# Patient Record
Sex: Female | Born: 1960 | Race: Black or African American | Hispanic: No | Marital: Single | Smoking: Never smoker
Health system: Southern US, Community
[De-identification: ages and names within clinical notes are randomized; demographics above are authoritative.]

## PROBLEM LIST (undated history)

## (undated) DIAGNOSIS — U071 COVID-19: Secondary | ICD-10-CM

## (undated) HISTORY — PX: ABDOMINAL HYSTERECTOMY: SHX81

---

## 2013-01-27 ENCOUNTER — Emergency Department (HOSPITAL_COMMUNITY): Payer: BC Managed Care – PPO

## 2013-01-27 ENCOUNTER — Encounter (HOSPITAL_COMMUNITY): Payer: Self-pay | Admitting: Nurse Practitioner

## 2013-01-27 ENCOUNTER — Emergency Department (HOSPITAL_COMMUNITY)
Admission: EM | Admit: 2013-01-27 | Discharge: 2013-01-27 | Disposition: A | Discharge status: Home / Self Care | Payer: BC Managed Care – PPO | Attending: Emergency Medicine | Admitting: Emergency Medicine

## 2013-01-27 DIAGNOSIS — R0602 Shortness of breath: Secondary | ICD-10-CM | POA: Insufficient documentation

## 2013-01-27 DIAGNOSIS — R0789 Other chest pain: Secondary | ICD-10-CM | POA: Insufficient documentation

## 2013-01-27 DIAGNOSIS — R079 Chest pain, unspecified: Secondary | ICD-10-CM

## 2013-01-27 LAB — PRO B NATRIURETIC PEPTIDE: Pro B Natriuretic peptide (BNP): 22 pg/mL (ref 0–125)

## 2013-01-27 LAB — POCT I-STAT TROPONIN I: Troponin i, poc: 0 ng/mL (ref 0.00–0.08)

## 2013-01-27 LAB — BASIC METABOLIC PANEL
BUN: 11 mg/dL (ref 6–23)
CO2: 29 mEq/L (ref 19–32)
Calcium: 9.3 mg/dL (ref 8.4–10.5)
Chloride: 102 mEq/L (ref 96–112)
GFR calc Af Amer: 71 mL/min — ABNORMAL LOW (ref >90)
GFR calc non Af Amer: 61 mL/min — ABNORMAL LOW (ref >90)

## 2013-01-27 LAB — CBC
HCT: 34.9 % — ABNORMAL LOW (ref 36.0–46.0)
MCV: 85.3 fL (ref 78.0–100.0)
RBC: 4.09 MIL/uL (ref 3.87–5.11)
RDW: 13.5 % (ref 11.5–15.5)
WBC: 7.6 10*3/uL (ref 4.0–10.5)

## 2013-01-27 MED ORDER — GI COCKTAIL ~~LOC~~
30.0000 mL | Freq: Once | ORAL | Status: AC
  Administered 2013-01-27: 30 mL via ORAL
  Filled 2013-01-27: qty 30

## 2013-01-27 NOTE — Discharge Instructions (Signed)
Chest Pain (Nonspecific) °It is often hard to give a specific diagnosis for the cause of chest pain. There is always a chance that your pain could be related to something serious, such as a heart attack or a blood clot in the lungs. You need to follow up with your caregiver for further evaluation. °CAUSES  °· Heartburn. °· Pneumonia or bronchitis. °· Anxiety or stress. °· Inflammation around your heart (pericarditis) or lung (pleuritis or pleurisy). °· A blood clot in the lung. °· A collapsed lung (pneumothorax). It can develop suddenly on its own (spontaneous pneumothorax) or from injury (trauma) to the chest. °· Shingles infection (herpes zoster virus). °The chest wall is composed of bones, muscles, and cartilage. Any of these can be the source of the pain. °· The bones can be bruised by injury. °· The muscles or cartilage can be strained by coughing or overwork. °· The cartilage can be affected by inflammation and become sore (costochondritis). °DIAGNOSIS  °Lab tests or other studies, such as X-rays, electrocardiography, stress testing, or cardiac imaging, may be needed to find the cause of your pain.  °TREATMENT  °· Treatment depends on what may be causing your chest pain. Treatment may include: °· Acid blockers for heartburn. °· Anti-inflammatory medicine. °· Pain medicine for inflammatory conditions. °· Antibiotics if an infection is present. °· You may be advised to change lifestyle habits. This includes stopping smoking and avoiding alcohol, caffeine, and chocolate. °· You may be advised to keep your head raised (elevated) when sleeping. This reduces the chance of acid going backward from your stomach into your esophagus. °· Most of the time, nonspecific chest pain will improve within 2 to 3 days with rest and mild pain medicine. °HOME CARE INSTRUCTIONS  °· If antibiotics were prescribed, take your antibiotics as directed. Finish them even if you start to feel better. °· For the next few days, avoid physical  activities that bring on chest pain. Continue physical activities as directed. °· Do not smoke. °· Avoid drinking alcohol. °· Only take over-the-counter or prescription medicine for pain, discomfort, or fever as directed by your caregiver. °· Follow your caregiver's suggestions for further testing if your chest pain does not go away. °· Keep any follow-up appointments you made. If you do not go to an appointment, you could develop lasting (chronic) problems with pain. If there is any problem keeping an appointment, you must call to reschedule. °SEEK MEDICAL CARE IF:  °· You think you are having problems from the medicine you are taking. Read your medicine instructions carefully. °· Your chest pain does not go away, even after treatment. °· You develop a rash with blisters on your chest. °SEEK IMMEDIATE MEDICAL CARE IF:  °· You have increased chest pain or pain that spreads to your arm, neck, jaw, back, or abdomen. °· You develop shortness of breath, an increasing cough, or you are coughing up blood. °· You have severe back or abdominal pain, feel nauseous, or vomit. °· You develop severe weakness, fainting, or chills. °· You have a fever. °THIS IS AN EMERGENCY. Do not wait to see if the pain will go away. Get medical help at once. Call your local emergency services (911 in [place] Do not drive yourself to the hospital. °MAKE SURE YOU:  °· Understand these instructions. °· Will watch your condition. °· Will get help right away if you are not doing well or get worse. °Document Released: 03/30/2005 Document Revised: 09/12/2011 Document Reviewed: 01/24/2008 °ExitCare® Patient Information ©2014 ExitCare,   LLC. RESOURCE GUIDE  Chronic Pain Problems: Contact [facility]  [phone] Patients need to be referred by their primary care doctor.  Insufficient Money for Medicine: Contact United Way:  call [phone]  No Primary Care Doctor: - Call Health Connect  [phone] - can help you locate a  primary care doctor that  accepts your insurance, provides certain services, etc. - Physician Referral Service- [phone]  Agencies that provide inexpensive medical care: - [facility]  [phone] - [facility]  [phone] - [facility]  [phone] - Women's Clinic  [phone] - [facility]  [phone] [facility]  [phone]  Medicaid-accepting [facility] Providers: - [facility]- [address]  [phone], Mon-Fri 9am-7pm, Sat 9am-1pm - [facility]- [address] [place], [address] [place]  [phone] - [facility]- [address] [place]  [phone] [facility]- [address]  [phone] - Renaye Rakers- [address] [place], [address], [phone]  Only accepts [place] patients after they have their name  applied to their card  Self Pay (no insurance) in [place]: - Sickle Cell Patients - [facility]  [address] [place], [phone] - [facility]- [address] [place]  [phone]       [facility]- [address]       -     [facility]- see information above (Speak to [employer] if you do not have insurance)       -  [facility]- [address],  [phone]       -  [facility]- [address] [phone]       -  Dr Julio Sicks-  [address], [place], [phone]       -  [facility] - [address], [phone]       -  [facility]- [address] [phone], also [address], [phone]       -     [facility]- [address] [place], [phone], 1st & 3rd Saturday         every month, 10am-1pm  -     [facility]   [address] [place], [place].   Phone:  [phone], Fax:  [phone]. Hours of Operation:  9 am - 6 pm,  M-F.  -     [facility]   [address], [place]   Phone: [phone], Fax: [phone]. Hours of Operation:  8:30 am - 5:30 pm, M-F.  [facility] [address] [place], [place] [address] [phone]  [facility] [address] [place], [place] [address] [phone]  1) Find a Doctor and Pay Out of Pocket Although you won't have to find out who is covered by your insurance plan, it is a good idea to ask around and get recommendations. You will then need to call the office and see if the doctor you have chosen will accept you as a new patient and what types of options they offer for patients who are self-pay. Some doctors offer discounts or will set up payment plans for their patients who do not have  insurance, but you will need to ask so you aren't surprised when you get to your appointment.  2) Contact Your Local Health Department Not all health departments have doctors that can see patients for sick visits, but many do, so it is worth a call to see if yours does. If you don't know where your local health department is, you can check in your phone book. The CDC also has a tool to help you locate your state's health department, and many state websites also have listings of all of their local health departments.  3) Find a Walk-in Clinic If your illness is not likely to be very severe or complicated, you may want to try a walk in clinic. These are popping up all over the country in pharmacies, drugstores, and shopping centers. They're usually staffed by nurse practitioners or physician assistants that have been trained to treat common illnesses and complaints. They're usually fairly quick and inexpensive. However, if you have serious medical issues or chronic medical problems, these are probably not your best option  STD Testing - [facility] Department of [facility], STD Clinic, [address]  [place], phone [phone] or [phone].  Monday - Friday, call for an appointment. [facility] Department of [employer], STD Clinic, [address], [place], phone [phone] or [phone].  Monday - Friday, call for an appointment.  Abuse/Neglect: [facility] Child Abuse Hotline 309-729-4189 [facility] Child Abuse Hotline 913-566-5120 (After Hours)  Emergency Shelter:  [facility] 2347549521  Maternity Homes: - Room at the [place] [facility] [phone] - [facility] 5621439540  MRSA Hotline #:   (704)032-0079  Dental Assistance If unable to pay or uninsured, contact:  [facility]. to become qualified for the adult dental clinic.  Patients with Medicaid: [facility] [phone] [address] Joellyn Quails, [phone] [address] [phone]  If unable to pay, or uninsured, contact [facility] [phone] in [place], [phone] in [facility]) to become qualified for the adult dental clinic  [facility] [address] [place], [place] [address] [phone] [email or web address]  Other Proofreader Services: - [facility]- [address] [place], [place], [place], [address], [phone], Ext. [phone], 2nd and 4th Thursday of the month at 6:30am.  10 clients each day by appointment, can sometimes see walk-in patients if someone does not show for an appointment. [facility]- [address] [place], [place], [address], [phone] - [facility] [address], [place], [place], [address], [phone] - [facility]- [phone] [facility] Health Department- [phone] [facility] Health [phone]       Behavioral Health Resources in the [facility]  Intensive Outpatient Programs: [facility]      [address] [place], [place] [phone] Both  a day and evening program       [facility] Outpatient     [address]        [place], [place] [address] [phone]         ADS: [facility] [address] [place] [phone]  [facility] Mental Health ACCESS LINE: (234)[phone] or [phone] [address] [place], [place] [address] [email or web address]   Substance Abuse Resources: - [facility] Drug [facility]  [phone] - [facility] 7658056536 - [facility] [phone] -  Floydene Flock [phone] - Residential & Outpatient Substance Abuse Program  [phone]  Psychological Services: [facility]  [phone] Services  [phone] - [facility], [phone] [place]. [address] [place], ACCESS LINE: [phone] or [phone], [email or web address]  Mobile Crisis Teams:                                        Therapeutic Alternatives         Mobile Crisis Care Unit [phone]             [facility] [address] Ginette Otto [phone]                                         Interventionist [address] [place] [phone]  Self-Help/Support Groups: Mental Health Assoc. of [employer] of support groups [phone] (call for more info)  Narcotics Anonymous (NA) [facility] [address] [place] - 2 meetings at this location  Residential Treatment Programs:  [facility]      [address]        [place]       [phone]         [facility] [address], [place] [address]16 [place], [place]  [address] [phone]  [facility]  [address] [place], [place] [address] [phone] Admissions: 8am-3pm M-F  [facility]     [address]        [place], [place]  [address]       [phone]         [facility] [address] [place]s [place], [place] [phone]  [facility] [address] [place], [place] [phone]  [facility] - Intensive Outpatient      [address]     [place], [place]       [phone]         [facility] (Addiction Recovery [facility]     [address] [place], [place] [phone] or [phone]  Residential Treatment Services (RTS), Medicaid [address] [place], [place] [phone]  [facility] [address]                                               [address] [place] [phone]  [facility] Resources: [phone]               General Therapy                                                Angie Fava, PhD        [address] [place], [place] [address]         [phone]   Insurance  [facility] Behavioral   [address] [place], [place] [address] [phone]  [facility] [address] [place], [place] [address] [phone] Insurance/Medicaid/sponsorship through [facility]                                              [address]                                        [place], [place] [address]    Therapy/tele-psych/case         [phone]          [facility] [address], [place]  [address]  Adolescent/group home/case management [phone]                                           Creola Corn PhD       General therapy       Insurance   [phone]         Dr. Lolly Mustache, Insurance, M-F [phone]  [facility] [place]  United Way [facility] Dept. [address]                 [address]         [address] [place] [address]  Blondell Reveal Phone:  [phone]                                  Phone:   [phone]                   Phone:  [phone]  [facility], [phone] - [facility] - [facility]- [phone]       -     [facility] in [place], [address]             [phone], Insurance  [place] Child Abuse Hotline [phone] or [phone] (After Hours)

## 2013-01-27 NOTE — ED Notes (Signed)
C/o midsternal CP intermittent throughout the day today, no pain now. States "when it comes it feels like a contraction." pain makes the pt feel SOB. Denies cardiac history

## 2013-01-27 NOTE — ED Provider Notes (Signed)
CSN: [id number]     Arrival date & time 01/27/13  1813 History     First MD Initiated Contact with Patient 01/27/13 1838     Chief Complaint  Patient presents with  . Chest Pain   (Consider location/radiation/quality/duration/timing/severity/associated sxs/prior Treatment) HPI Comments: Patient presents emergency department with chief complaint of intermittent chest pain which started today. She's currently not complaining of any chest pain, but states that earlier she felt a sharp pain in her mid chest. States that it radiated to her neck. She states that it comes and goes, and sometimes feels like a burning. She states that there was some mild associated shortness of breath. She has not taken anything to alleviate her symptoms. Nothing makes his symptoms better or worse. She does not have any cardiac risk factors.  The history is provided by the patient. No language interpreter was used.    History reviewed. No pertinent past medical history. Past Surgical History  Procedure Laterality Date  . Abdominal hysterectomy     History reviewed. No pertinent family history. History  Substance Use Topics  . Smoking status: Never Smoker   . Smokeless tobacco: Not on file  . Alcohol Use: No   OB History   Grav Para Term Preterm Abortions TAB SAB Ect Mult Living                 Review of Systems  All other systems reviewed and are negative.    Allergies  Review of patient's allergies indicates no known allergies.  Home Medications  No current outpatient prescriptions on file. BP 113/62  Pulse 95  Temp(Src) 98.5 F (36.9 C) (Oral)  Resp 16  Ht 5\' 4"  (1.626 m)  Wt 165 lb (74.844 kg)  BMI 28.31 kg/m2  SpO2 98% Physical Exam  Nursing note and vitals reviewed. Constitutional: She is oriented to person, place, and time. She appears well-developed and well-nourished.  HENT:  Head: Normocephalic and atraumatic.  Eyes: Conjunctivae and EOM are normal. Pupils are equal, round,  and reactive to light.  Neck: Normal range of motion. Neck supple.  Cardiovascular: Normal rate and regular rhythm.  Exam reveals no gallop and no friction rub.   No murmur heard. Pulmonary/Chest: Effort normal and breath sounds normal. No respiratory distress. She has no wheezes. She has no rales. She exhibits no tenderness.  Abdominal: Soft. Bowel sounds are normal. She exhibits no distension and no mass. There is no tenderness. There is no rebound and no guarding.  Musculoskeletal: Normal range of motion. She exhibits no edema and no tenderness.  Neurological: She is alert and oriented to person, place, and time.  Skin: Skin is warm and dry.  Psychiatric: She has a normal mood and affect. Her behavior is normal. Judgment and thought content normal.    ED Course   Procedures (including critical care time)  Labs Reviewed  CBC - Abnormal; Notable for the following:    HCT 34.9 (*)    MCHC 36.1 (*)    All other components within normal limits  BASIC METABOLIC PANEL - Abnormal; Notable for the following:    GFR calc non Af Amer 61 (*)    GFR calc Af Amer 71 (*)    All other components within normal limits  PRO B NATRIURETIC PEPTIDE  ED ECG REPORT  I personally interpreted this EKG   Date: 01/27/2013   Rate: 96  Rhythm: normal sinus rhythm  QRS Axis: normal  Intervals: normal  ST/T Wave abnormalities: normal  Conduction Disutrbances:none  Narrative Interpretation:   Old EKG Reviewed: none available   Dg Chest 2 View  01/27/2013   *RADIOLOGY REPORT*  Clinical Data: Chest pain and cough.  CHEST - 2 VIEW  Comparison: 01/24/2013  Findings: Heart size and vascularity are normal and the lungs are clear.  No osseous abnormality.  IMPRESSION: Normal chest.   Original Report Authenticated By: Francene Boyers, M.D.   1. Chest pain     MDM  Patient with nonspecific chest pain, no chest pain in the emergency department, TIMI score is 0, she has Well's PE score of 0.  Chest x-ray is clear.  Doubt cardiac etiology. Labs, and EKG are reassuring. Patient discussed with Dr. Rhunette Croft, who agrees with the plan. She is stable and ready for discharge. Discharge to home with primary care followup.   Roxy Horseman, PA-C 01/27/13 2119

## 2013-01-27 NOTE — ED Notes (Signed)
Pt alert, NAD, calm, interactive, skin W&D, resps e/u, speaking in clear complete sentences, LS CTA, pulse regular and strong, (denies: pain, dizziness, nausea or sob), "feels better", ambulatory to b/r with steady gait.

## 2013-01-28 NOTE — ED Provider Notes (Signed)
Medical screening examination/treatment/procedure(s) were performed by non-physician practitioner and as supervising physician I was immediately available for consultation/collaboration.  Nadia Viar, MD 01/28/13 1716 

## 2013-04-09 ENCOUNTER — Emergency Department (HOSPITAL_COMMUNITY): Payer: BC Managed Care – PPO

## 2013-04-09 ENCOUNTER — Encounter (HOSPITAL_COMMUNITY): Payer: Self-pay | Admitting: Nurse Practitioner

## 2013-04-09 ENCOUNTER — Emergency Department (HOSPITAL_COMMUNITY)
Admission: EM | Admit: 2013-04-09 | Discharge: 2013-04-09 | Disposition: A | Discharge status: Home / Self Care | Payer: BC Managed Care – PPO | Attending: Emergency Medicine | Admitting: Emergency Medicine

## 2013-04-09 DIAGNOSIS — R51 Headache: Secondary | ICD-10-CM | POA: Insufficient documentation

## 2013-04-09 DIAGNOSIS — R110 Nausea: Secondary | ICD-10-CM | POA: Insufficient documentation

## 2013-04-09 MED ORDER — TRAMADOL HCL 50 MG PO TABS: 50.0000 mg | ORAL_TABLET | Freq: Four times a day (QID) | ORAL | Status: DC | PRN

## 2013-04-09 MED ORDER — KETOROLAC TROMETHAMINE 30 MG/ML IJ SOLN
30.0000 mg | Freq: Once | INTRAMUSCULAR | Status: AC
  Administered 2013-04-09: 30 mg via INTRAVENOUS
  Filled 2013-04-09: qty 1

## 2013-04-09 MED ORDER — METOCLOPRAMIDE HCL 5 MG/ML IJ SOLN
10.0000 mg | Freq: Once | INTRAMUSCULAR | Status: AC
  Administered 2013-04-09: 10 mg via INTRAVENOUS
  Filled 2013-04-09: qty 2

## 2013-04-09 MED ORDER — SODIUM CHLORIDE 0.9 % IV BOLUS (SEPSIS)
1000.0000 mL | Freq: Once | INTRAVENOUS | Status: AC
  Administered 2013-04-09: 1000 mL via INTRAVENOUS

## 2013-04-09 MED ORDER — DIPHENHYDRAMINE HCL 50 MG/ML IJ SOLN
25.0000 mg | Freq: Once | INTRAMUSCULAR | Status: AC
  Administered 2013-04-09: 25 mg via INTRAVENOUS
  Filled 2013-04-09: qty 1

## 2013-04-09 MED ORDER — CYCLOBENZAPRINE HCL 10 MG PO TABS: 10.0000 mg | ORAL_TABLET | Freq: Two times a day (BID) | ORAL | Status: DC | PRN

## 2013-04-09 NOTE — ED Notes (Signed)
Pt c/o sudden onset severe R sided headache "sharp and shooting pain" this am. Nothing makes pain better or worse. Took nothing for pain. A&Ox4

## 2013-04-09 NOTE — ED Provider Notes (Signed)
CSN: [id number]     Arrival date & time 04/09/13  4098 History   First MD Initiated Contact with Patient 04/09/13 [phone]     Chief Complaint  Patient presents with  . Headache   (Consider location/radiation/quality/duration/timing/severity/associated sxs/prior Treatment) HPI Comments: Patient is a 52 year old female with no past medical history who presents with a headache that started this morning. Symptoms started suddenly and remained constant since the onset. The pain is located in the right side of her head. Patient reports feeling like she "was punched in the head." The pain is sharp and shooting and does not radiate. Patient reports associated nausea. No aggravating/alleviating factors. No other associated symptoms. Patient does not take blood thinner.    History reviewed. No pertinent past medical history. Past Surgical History  Procedure Laterality Date  . Abdominal hysterectomy     History reviewed. No pertinent family history. History  Substance Use Topics  . Smoking status: Never Smoker   . Smokeless tobacco: Not on file  . Alcohol Use: No   OB History   Grav Para Term Preterm Abortions TAB SAB Ect Mult Living                 Review of Systems  Gastrointestinal: Positive for nausea.  Neurological: Positive for headaches.  All other systems reviewed and are negative.    Allergies  Review of patient's allergies indicates no known allergies.  Home Medications  No current outpatient prescriptions on file. BP 108/71  Pulse 88  Temp(Src) 97.7 F (36.5 C) (Oral)  Resp 18  Ht 5\' 4"  (1.626 m)  Wt 160 lb (72.576 kg)  BMI 27.45 kg/m2  SpO2 99% Physical Exam  Nursing note and vitals reviewed. Constitutional: She is oriented to person, place, and time. She appears well-developed and well-nourished. No distress.  HENT:  Head: Normocephalic and atraumatic.  Mouth/Throat: Oropharynx is clear and moist. No oropharyngeal exudate.  No tenderness to palpation of scalp.    Eyes: Conjunctivae and EOM are normal. Pupils are equal, round, and reactive to light.  Neck: Normal range of motion.  No meningeal signs.   Cardiovascular: Normal rate and regular rhythm.  Exam reveals no gallop and no friction rub.   No murmur heard. Pulmonary/Chest: Effort normal and breath sounds normal. She has no wheezes. She has no rales. She exhibits no tenderness.  Abdominal: Soft. She exhibits no distension. There is no tenderness. There is no rebound and no guarding.  Musculoskeletal: Normal range of motion.  Neurological: She is alert and oriented to person, place, and time. No cranial nerve deficit. Coordination normal.  Extremity strength and sensation equal and intact bilaterally. Speech is goal-oriented. Moves limbs without ataxia.   Skin: Skin is warm and dry.  Psychiatric: She has a normal mood and affect. Her behavior is normal.    ED Course  Procedures (including critical care time) Labs Review Labs Reviewed - No data to display Imaging Review Ct Head Wo Contrast  04/09/2013   CLINICAL DATA:  Sudden onset of right-sided headache  EXAM: CT HEAD WITHOUT CONTRAST  TECHNIQUE: Contiguous axial images were obtained from the base of the skull through the vertex without intravenous contrast.  COMPARISON:  None.  FINDINGS: No skull fracture is noted. Paranasal sinuses and mastoid air cells are unremarkable.  No intracranial hemorrhage, mass effect or midline shift.  No acute infarction. No mass lesion is noted on this unenhanced scan. The gray and white-matter differentiation is preserved. No hydrocephalus.  IMPRESSION: No acute  intracranial abnormality.   Electronically Signed   By: Natasha Mead M.D.   On: 04/09/2013 10:54    MDM   1. Headache     10:34 AM CT head pending. Patient will have fluids, toradol, reglan, and benadryl if CT results are unremarkable. Vitals stable and patient afebrile. No meningeal signs.   12:19 PM CT scan unremarkable for acute changes. Patient  feels better after migraine cocktail. Patient will be discharged without further evaluation. Patient instructed to return with worsening or concerning symptoms.     Emilia Beck, PA-C 04/09/13 1220

## 2013-04-09 NOTE — ED Provider Notes (Signed)
Medical screening examination/treatment/procedure(s) were performed by non-physician practitioner and as supervising physician I was immediately available for consultation/collaboration.   Alexah Kivett R Heath Tesler, MD 04/09/13 1231 

## 2014-08-30 ENCOUNTER — Ambulatory Visit (INDEPENDENT_AMBULATORY_CARE_PROVIDER_SITE_OTHER): Payer: Self-pay

## 2014-08-30 ENCOUNTER — Ambulatory Visit (INDEPENDENT_AMBULATORY_CARE_PROVIDER_SITE_OTHER): Payer: Self-pay | Admitting: Family Medicine

## 2014-08-30 VITALS — BP 116/80 | HR 99 | Temp 101.4°F | Resp 18 | Ht 64.0 in | Wt 163.4 lb

## 2014-08-30 DIAGNOSIS — R058 Other specified cough: Secondary | ICD-10-CM

## 2014-08-30 DIAGNOSIS — R05 Cough: Secondary | ICD-10-CM

## 2014-08-30 DIAGNOSIS — R509 Fever, unspecified: Secondary | ICD-10-CM

## 2014-08-30 DIAGNOSIS — M791 Myalgia, unspecified site: Secondary | ICD-10-CM

## 2014-08-30 DIAGNOSIS — J189 Pneumonia, unspecified organism: Secondary | ICD-10-CM

## 2014-08-30 LAB — POCT CBC
GRANULOCYTE PERCENT: 76.6 % (ref 37–80)
HCT, POC: 38.7 % (ref 37.7–47.9)
HEMOGLOBIN: 12.5 g/dL (ref 12.2–16.2)
LYMPH, POC: 3 (ref 0.6–3.4)
MCH: 29.2 pg (ref 27–31.2)
MCHC: 32.3 g/dL (ref 31.8–35.4)
MCV: 90.2 fL (ref 80–97)
MID (CBC): 1 — AB (ref 0–0.9)
MPV: 7.8 fL (ref 0–99.8)
PLATELET COUNT, POC: 246 10*3/uL (ref 142–424)
POC GRANULOCYTE: 13 — AB (ref 2–6.9)
POC LYMPH %: 17.6 % (ref 10–50)
POC MID %: 5.7 %M (ref 0–12)
RBC: 4.29 M/uL (ref 4.04–5.48)
RDW, POC: 14.3 %
WBC: 16.9 10*3/uL — AB (ref 4.6–10.2)

## 2014-08-30 LAB — POCT INFLUENZA A/B
INFLUENZA A, POC: NEGATIVE
INFLUENZA B, POC: NEGATIVE

## 2014-08-30 MED ORDER — AZITHROMYCIN 250 MG PO TABS: ORAL_TABLET | ORAL | Status: DC

## 2014-08-30 MED ORDER — HYDROCOD POLST-CHLORPHEN POLST 10-8 MG/5ML PO LQCR
5.0000 mL | Freq: Two times a day (BID) | ORAL | Status: DC | PRN
Start: 2014-08-30 — End: 2016-11-23

## 2014-08-30 MED ORDER — BENZONATATE 100 MG PO CAPS: 100.0000 mg | ORAL_CAPSULE | Freq: Three times a day (TID) | ORAL | Status: DC | PRN

## 2014-08-30 MED ORDER — CEFTRIAXONE SODIUM 1 G IJ SOLR
1.0000 g | Freq: Once | INTRAMUSCULAR | Status: AC
  Administered 2014-08-30: 1 g via INTRAMUSCULAR

## 2014-08-30 NOTE — Patient Instructions (Signed)
Take antibiotic until finished. Use cough syrup at night to help you sleep. Use tessalon during the day for cough. Drink plenty of water and get plenty of rest. Continue with ibuprofen/tylenol for fever. Return with any problems/concerns.

## 2014-08-30 NOTE — Progress Notes (Signed)
Subjective:    Patient ID: Karina Mueller, female    DOB: [birth year], 54 y.o.   MRN: [id number]  HPI  This is a 54 year old female presenting with 4 days of fever, cough, myalgias, headache. Cough is productive of yellow thick sputum. States she had chills all night last night, couldn't get warm. Has full body myalgias. Denies nasal congestion, sore throat, SOB, wheezing. No history of asthma and not a smoker. No one at home is sick. Been taking ibuprofen and drinking water with lemon and honey with minimal help.   Review of Systems  Constitutional: Positive for fever, chills and fatigue.  HENT: Negative for congestion, ear pain, sinus pressure and sore throat.   Eyes: Negative for redness.  Respiratory: Positive for cough. Negative for shortness of breath and wheezing.   Gastrointestinal: Negative for nausea, vomiting, abdominal pain and diarrhea.  Musculoskeletal: Positive for myalgias.  Skin: Negative for rash.  Neurological: Positive for headaches.  Hematological: Negative for adenopathy.  Psychiatric/Behavioral: Positive for sleep disturbance.    There are no active problems to display for this patient.  Prior to Admission medications   Not on File   No Known Allergies  Patient's social and family history were reviewed.     Objective:   Physical Exam  Constitutional: She is oriented to person, place, and time. She appears well-developed and well-nourished. No distress.  HENT:  Head: Normocephalic and atraumatic.  Right Ear: Hearing, tympanic membrane, external ear and ear canal normal.  Left Ear: Hearing, tympanic membrane, external ear and ear canal normal.  Nose: Mucosal edema present.  Mouth/Throat: Uvula is midline, oropharynx is clear and moist and mucous membranes are normal.  Eyes: Conjunctivae and lids are normal. Right eye exhibits no discharge. Left eye exhibits no discharge. No scleral icterus.  Cardiovascular: Normal rate, regular rhythm, normal heart sounds  and normal pulses.   Pulmonary/Chest: Effort normal and breath sounds normal. No respiratory distress. She has no wheezes. She has no rhonchi. She has no rales.  Abdominal: Soft. Normal appearance. There is no tenderness.  Musculoskeletal: Normal range of motion.  Lymphadenopathy:       Head (right side): No submental, no submandibular, no tonsillar, no preauricular, no posterior auricular and no occipital adenopathy present.       Head (left side): No submental, no submandibular, no tonsillar, no preauricular, no posterior auricular and no occipital adenopathy present.    She has no cervical adenopathy.  Neurological: She is alert and oriented to person, place, and time.  Skin: Skin is warm, dry and intact.  Psychiatric: She has a normal mood and affect. Her speech is normal and behavior is normal. Thought content normal.   BP 116/80 mmHg  Pulse 99  Temp(Src) 101.4 F (38.6 C) (Oral)  Resp 18  Ht 5\' 4"  (1.626 m)  Wt 163 lb 6.4 oz (74.118 kg)  BMI 28.03 kg/m2  SpO2 96%  Results for orders placed or performed in visit on 08/30/14  POCT CBC  Result Value Ref Range   WBC 16.9 (A) 4.6 - 10.2 K/uL   Lymph, poc 3.0 0.6 - 3.4   POC LYMPH PERCENT 17.6 10 - 50 %L   MID (cbc) 1.0 (A) 0 - 0.9   POC MID % 5.7 0 - 12 %M   POC Granulocyte 13.0 (A) 2 - 6.9   Granulocyte percent 76.6 37 - 80 %G   RBC 4.29 4.04 - 5.48 M/uL   Hemoglobin 12.5 12.2 - 16.2 g/dL  HCT, POC 38.7 37.7 - 47.9 %   MCV 90.2 80 - 97 fL   MCH, POC 29.2 27 - 31.2 pg   MCHC 32.3 31.8 - 35.4 g/dL   RDW, POC 16.1 %   Platelet Count, POC 246 142 - 424 K/uL   MPV 7.8 0 - 99.8 fL  POCT Influenza A/B  Result Value Ref Range   Influenza A, POC Negative    Influenza B, POC Negative    [facility] reading (PRIMARY) by  Dr. Alwyn Ren: early right upper and left lower infiltrate.     Assessment & Plan:  1. Fever, unspecified fever cause 2. Myalgia 3. Productive cough 4. Pneumonia, organism unspecified Pt will take zithromax for  pneumonia. Rocephin 1 gm given in office. tussionex and tessalon for symptom control. She will return if not getting better in 7-10 days.  - POCT CBC - POCT Influenza A/B - DG Chest 2 View; Future - cefTRIAXone (ROCEPHIN) injection 1 g; Inject 1 g into the muscle once. - azithromycin (ZITHROMAX) 250 MG tablet; Take 2 tabs PO x 1 dose, then 1 tab PO QD x 4 days  Dispense: 6 tablet; Refill: 0 - chlorpheniramine-HYDROcodone (TUSSIONEX PENNKINETIC ER) 10-8 MG/5ML LQCR; Take 5 mLs by mouth every 12 (twelve) hours as needed for cough (cough).  Dispense: 80 mL; Refill: 0 - benzonatate (TESSALON) 100 MG capsule; Take 1-2 capsules (100-200 mg total) by mouth 3 (three) times daily as needed for cough.  Dispense: 40 capsule; Refill: 0   [place] [employer]. Dyke Brackett, MHS [facility]  08/30/2014

## 2014-08-30 NOTE — Progress Notes (Signed)
PA presented the patient's history to me. X-ray was examined. There is an apparent right upper lobe pneumonia, and also a possible left lower lobe pneumonia. With the elevated white blood count this is all consistent. Treatment plan was discussed and agreed upon.

## 2014-09-01 ENCOUNTER — Telehealth: Payer: Self-pay

## 2014-09-01 NOTE — Telephone Encounter (Signed)
Note wriiten and faxed.

## 2014-09-01 NOTE — Telephone Encounter (Signed)
Pt  Requesting oow note faxed to [facility] @ [phone] for today until wed      Best phone  For pt is [phone]

## 2015-03-18 ENCOUNTER — Ambulatory Visit: Payer: Self-pay | Admitting: Physical Therapy

## 2015-04-08 ENCOUNTER — Other Ambulatory Visit (HOSPITAL_COMMUNITY)
Admission: RE | Admit: 2015-04-08 | Discharge: 2015-04-08 | Disposition: A | Discharge status: Home / Self Care | Payer: BLUE CROSS/BLUE SHIELD | Source: Ambulatory Visit | Attending: Internal Medicine | Admitting: Internal Medicine

## 2015-04-08 ENCOUNTER — Other Ambulatory Visit: Payer: Self-pay | Admitting: Internal Medicine

## 2015-04-08 DIAGNOSIS — Z01419 Encounter for gynecological examination (general) (routine) without abnormal findings: Secondary | ICD-10-CM | POA: Diagnosis present

## 2015-04-08 DIAGNOSIS — Z1151 Encounter for screening for human papillomavirus (HPV): Secondary | ICD-10-CM | POA: Diagnosis not present

## 2015-04-10 LAB — CYTOLOGY - PAP

## 2015-09-12 ENCOUNTER — Encounter (HOSPITAL_COMMUNITY): Payer: Self-pay | Admitting: Emergency Medicine

## 2015-09-12 ENCOUNTER — Emergency Department (HOSPITAL_COMMUNITY): Payer: BLUE CROSS/BLUE SHIELD

## 2015-09-12 ENCOUNTER — Emergency Department (HOSPITAL_COMMUNITY)
Admission: EM | Admit: 2015-09-12 | Discharge: 2015-09-12 | Disposition: A | Discharge status: Home / Self Care | Payer: BLUE CROSS/BLUE SHIELD | Attending: Emergency Medicine | Admitting: Emergency Medicine

## 2015-09-12 DIAGNOSIS — M542 Cervicalgia: Secondary | ICD-10-CM

## 2015-09-12 DIAGNOSIS — Y9289 Other specified places as the place of occurrence of the external cause: Secondary | ICD-10-CM | POA: Diagnosis not present

## 2015-09-12 DIAGNOSIS — S6992XA Unspecified injury of left wrist, hand and finger(s), initial encounter: Secondary | ICD-10-CM | POA: Diagnosis present

## 2015-09-12 DIAGNOSIS — Y9389 Activity, other specified: Secondary | ICD-10-CM | POA: Insufficient documentation

## 2015-09-12 DIAGNOSIS — S63502A Unspecified sprain of left wrist, initial encounter: Secondary | ICD-10-CM | POA: Insufficient documentation

## 2015-09-12 DIAGNOSIS — T148 Other injury of unspecified body region: Secondary | ICD-10-CM | POA: Diagnosis not present

## 2015-09-12 DIAGNOSIS — T148XXA Other injury of unspecified body region, initial encounter: Secondary | ICD-10-CM

## 2015-09-12 DIAGNOSIS — W19XXXA Unspecified fall, initial encounter: Secondary | ICD-10-CM

## 2015-09-12 DIAGNOSIS — S79912A Unspecified injury of left hip, initial encounter: Secondary | ICD-10-CM | POA: Insufficient documentation

## 2015-09-12 DIAGNOSIS — S199XXA Unspecified injury of neck, initial encounter: Secondary | ICD-10-CM | POA: Diagnosis not present

## 2015-09-12 DIAGNOSIS — W109XXA Fall (on) (from) unspecified stairs and steps, initial encounter: Secondary | ICD-10-CM | POA: Insufficient documentation

## 2015-09-12 DIAGNOSIS — Y998 Other external cause status: Secondary | ICD-10-CM | POA: Diagnosis not present

## 2015-09-12 DIAGNOSIS — M25552 Pain in left hip: Secondary | ICD-10-CM

## 2015-09-12 DIAGNOSIS — S299XXA Unspecified injury of thorax, initial encounter: Secondary | ICD-10-CM

## 2015-09-12 DIAGNOSIS — S29001A Unspecified injury of muscle and tendon of front wall of thorax, initial encounter: Secondary | ICD-10-CM | POA: Diagnosis not present

## 2015-09-12 MED ORDER — CYCLOBENZAPRINE HCL 10 MG PO TABS
10.0000 mg | ORAL_TABLET | Freq: Three times a day (TID) | ORAL | Status: DC | PRN
Start: 2015-09-12 — End: 2016-11-23

## 2015-09-12 MED ORDER — NAPROXEN 500 MG PO TABS: 500.0000 mg | ORAL_TABLET | Freq: Two times a day (BID) | ORAL | Status: DC | PRN

## 2015-09-12 MED ORDER — HYDROCODONE-ACETAMINOPHEN 5-325 MG PO TABS: 1.0000 | ORAL_TABLET | Freq: Four times a day (QID) | ORAL | Status: DC | PRN

## 2015-09-12 MED ORDER — IBUPROFEN 400 MG PO TABS
600.0000 mg | ORAL_TABLET | Freq: Once | ORAL | Status: AC
  Administered 2015-09-12: 600 mg via ORAL
  Filled 2015-09-12: qty 1

## 2015-09-12 NOTE — ED Provider Notes (Signed)
CSN: [id number]     Arrival date & time 09/12/15  0759 History   First MD Initiated Contact with Patient 09/12/15 [phone]     Chief Complaint  Patient presents with  . Fall     (Consider location/radiation/quality/duration/timing/severity/associated sxs/prior Treatment) HPI Comments: Karina Mueller is a 55 y.o. female with a PSHx of abd hysterectomy, who presents to the ED with complaints of mechanical fall around 6 AM when she slipped down her staircase, stating that she has carpeting on her stairs and she caught the edge of one step causing her foot to slip off the edge, landing on her butt and sliding down on her left side approximately 7 steps. She endorses left neck pain, left wrist pain, left rib cage pain, and left hip pain. She describes the pain as 5/10 soreness constant nonradiating worse with palpation and with no treatments tried prior to arrival. She denies any head injury or loss of consciousness, bruising, abrasions, joint swelling, headache, vision changes, syncope, chest pain, shortness of breath, abdominal pain, nausea, vomiting, incontinence of urine or stool, cauda equina symptoms, numbness, tingling, weakness, or back pain. She is not on any blood thinners.  Patient is a 55 y.o. female presenting with fall. The history is provided by the patient. No language interpreter was used.  Fall This is a new problem. The current episode started today. The problem occurs rarely. The problem has been unchanged. Associated symptoms include arthralgias, myalgias and neck pain. Pertinent negatives include no abdominal pain, chest pain, chills, fever, headaches, joint swelling, nausea, numbness, urinary symptoms, visual change, vomiting or weakness. Exacerbated by: palpation. She has tried nothing for the symptoms. The treatment provided no relief.    History reviewed. No pertinent past medical history. Past Surgical History  Procedure Laterality Date  . Abdominal hysterectomy     No family  history on file. Social History  Substance Use Topics  . Smoking status: Never Smoker   . Smokeless tobacco: None  . Alcohol Use: No   OB History    No data available     Review of Systems  Constitutional: Negative for fever and chills.  HENT: Negative for facial swelling (no head inj).   Eyes: Negative for visual disturbance.  Respiratory: Negative for shortness of breath.   Cardiovascular: Negative for chest pain.  Gastrointestinal: Negative for nausea, vomiting and abdominal pain.  Genitourinary: Negative for difficulty urinating (no incontinence).  Musculoskeletal: Positive for myalgias, arthralgias and neck pain. Negative for back pain and joint swelling.  Skin: Negative for color change and wound.  Allergic/Immunologic: Negative for immunocompromised state.  Neurological: Negative for syncope, weakness, numbness and headaches.  Hematological: Does not bruise/bleed easily.  Psychiatric/Behavioral: Negative for confusion.   10 Systems reviewed and are negative for acute change except as noted in the HPI.    Allergies  Review of patient's allergies indicates no known allergies.  Home Medications   Prior to Admission medications   Medication Sig Start Date End Date Taking? Authorizing Provider  azithromycin (ZITHROMAX) 250 MG tablet Take 2 tabs PO x 1 dose, then 1 tab PO QD x 4 days 08/30/14   Dorna Leitz, PA-C  benzonatate (TESSALON) 100 MG capsule Take 1-2 capsules (100-200 mg total) by mouth 3 (three) times daily as needed for cough. 08/30/14   Dorna Leitz, PA-C  chlorpheniramine-HYDROcodone (TUSSIONEX PENNKINETIC ER) 10-8 MG/5ML LQCR Take 5 mLs by mouth every 12 (twelve) hours as needed for cough (cough). 08/30/14   Dorna Leitz, PA-C  BP 105/67 mmHg  Pulse 65  Temp(Src) 97.7 F (36.5 C) (Oral)  Resp 14  SpO2 99% Physical Exam  Constitutional: She is oriented to person, place, and time. Vital signs are normal. She appears well-developed and well-nourished.   Non-toxic appearance. No distress.  Afebrile, nontoxic, NAD  HENT:  Head: Normocephalic and atraumatic.  Mouth/Throat: Oropharynx is clear and moist and mucous membranes are normal.  [place]/AT, no scalp crepitus or step offs  Eyes: Conjunctivae and EOM are normal. Right eye exhibits no discharge. Left eye exhibits no discharge.  Neck: Normal range of motion. Neck supple. Muscular tenderness present. No spinous process tenderness present. No rigidity. Normal range of motion present.    FROM intact without spinous process TTP, no bony stepoffs or deformities, with mild L sided paraspinous muscle/SCM TTP with slight muscle spasms. No rigidity or meningeal signs. No bruising or swelling.   Cardiovascular: Normal rate, regular rhythm, normal heart sounds and intact distal pulses.  Exam reveals no gallop and no friction rub.   No murmur heard. Pulmonary/Chest: Effort normal and breath sounds normal. No respiratory distress. She has no decreased breath sounds. She has no wheezes. She has no rhonchi. She has no rales. She exhibits tenderness. She exhibits no crepitus, no deformity and no retraction.  CTAB in all lung fields, no w/r/r, no hypoxia or increased WOB, speaking in full sentences, SpO2 99% on RA  Chest wall with mild TTP along L lateral rib cage, no crepitus or deformity, no bruising or retractions.  Abdominal: Soft. Normal appearance and bowel sounds are normal. She exhibits no distension. There is no tenderness. There is no rigidity, no rebound, no guarding, no CVA tenderness, no tenderness at McBurney's point and negative Murphy's sign.  Musculoskeletal: Normal range of motion.       Left wrist: She exhibits tenderness and bony tenderness. She exhibits normal range of motion, no swelling, no crepitus, no deformity and no laceration.       Left hip: She exhibits tenderness and bony tenderness. She exhibits normal range of motion, normal strength, no swelling, no crepitus and no deformity.        Arms:      Legs: L wrist with FROM intact with mild TTP in anatomical snuffbox, no swelling or effusion, no crepitus or deformity, no abrasions or bruising.  L hip with FROM intact with mild lateral joint line TTP, no swelling or effusion, no limb-length discrepancy or abnormal rotation, no crepitus or deformity, no bruising or abrasions. Strength and sensation grossly intact Distal pulses intact All other spinal levels nonTTP without step offs or deformities Soft compartments in all extremities No other focal bony TTP in all other extremities  Neurological: She is alert and oriented to person, place, and time. She has normal strength. No sensory deficit.  Skin: Skin is warm, dry and intact. No rash noted.  Psychiatric: She has a normal mood and affect.  Nursing note and vitals reviewed.   ED Course  Procedures (including critical care time) Labs Review Labs Reviewed - No data to display  Imaging Review Dg Ribs Unilateral W/chest Left  09/12/2015  CLINICAL DATA:  55 year old female with a history of fall and left posterior lower rib pain. EXAM: LEFT RIBS AND CHEST - 3+ VIEW COMPARISON:  08/30/2014 FINDINGS: Cardiomediastinal silhouette unchanged in size and contour. Improved aeration of the bilateral lungs compared to the prior plain film. No pneumothorax or pleural effusion. No displaced fracture, with attention to the left-sided ribs. IMPRESSION: Negative for acute  cardiopulmonary disease, with improved aeration compared to the prior plain film. No displaced fracture, with attention to the left-sided ribs. Signed, Yvone NeuJaime S. Loreta AveWagner, DO [facility] Electronically Signed   By: Gilmer MorJaime  Wagner D.O.   On: 09/12/2015 09:58   Dg Wrist Complete Left  09/12/2015  CLINICAL DATA:  55 year old female with a history of fall with left wrist pain. EXAM: LEFT WRIST - COMPLETE 3+ VIEW COMPARISON:  None. FINDINGS: No acute fracture identified. No  significant soft tissue swelling. No radiopaque foreign body. Carpal bones maintain alignment. Unremarkable scaphoid view. IMPRESSION: Negative for acute bony abnormality. Signed, Yvone NeuJaime S. Loreta AveWagner, DO [facility] Electronically Signed   By: Gilmer MorJaime  Wagner D.O.   On: 09/12/2015 09:56   Dg Hip Unilat With Pelvis 2-3 Views Left  09/12/2015  CLINICAL DATA:  55 year old female with a history of fall and left hip pain EXAM: DG HIP (WITH OR WITHOUT PELVIS) 2-3V LEFT COMPARISON:  None. FINDINGS: Bony pelvic ring appears intact. No acute fracture identified. Bilateral hips projects normally over the acetabula. Minimal degenerative changes of the lumbar spine. IMPRESSION: Negative for acute bony abnormality. Signed, Yvone NeuJaime S. Loreta AveWagner, DO [facility] Electronically Signed   By: Gilmer MorJaime  Wagner D.O.   On: 09/12/2015 09:57   I have personally reviewed and evaluated these images and lab results as part of my medical decision-making.   EKG Interpretation None      MDM   Final diagnoses:  Fall, initial encounter  Neck pain  Left hip pain  Left wrist sprain, initial encounter  Contusion  Soft tissue injury of left chest wall    55 y.o. female here s/p mechanical fall, c/o L neck pain, L wrist pain, L rib cage pain, and L hip pain. NVI with soft compartments. Neck with no midline tenderness, mild L SCM tenderness and slight spasm. Tenderness in L wrist anatomical snuffbox, mild tenderness in L hip jointline and L ribcage. No abrasions or contusions. Will obtain xrays of wrist, hip, and chest, doubt need for imaging of neck. Will give ibuprofen since pt doesn't have a ride home yet. No head inj/LOC, doubt need for head imaging. Will reassess shortly.   10:02 AM Xrays of wrist, hip, and ribs neg for acute findings. Minimal arthritis to L hip. Will treat wrist sprain conservatively with thumb spica  splint, given the anatomical snuffbox tenderness. Will send home with pain meds and flexeril for muscle spasms. F/up with PCP/[facility] in 1-2 wks and/or orthopedist for ongoing symptoms in 1-2 wks. Discussed ice/heat use. I explained the diagnosis and have given explicit precautions to return to the ER including for any other new or worsening symptoms. The patient understands and accepts the medical plan as it's been dictated and I have answered their questions. Discharge instructions concerning home care and prescriptions have been given. The patient is STABLE and is discharged to home in good condition.  BP 105/67 mmHg  Pulse 65  Temp(Src) 97.7 F (36.5 C) (Oral)  Resp 14  SpO2 99%  Meds ordered this encounter  Medications  . ibuprofen (ADVIL,MOTRIN) tablet 600 mg    Sig:   . HYDROcodone-acetaminophen (NORCO) 5-325 MG tablet    Sig: Take 1-2 tablets by mouth every 6 (six) hours as needed for severe pain.    Dispense:  6 tablet    Refill:  0    Order Specific Question:  Supervising Provider    Answer:  MILLER, BRIAN [[id number]]  . naproxen (NAPROSYN) 500 MG tablet    Sig: Take 1 tablet (500 mg total) by mouth 2 (two) times daily as needed for mild pain, moderate pain or headache (TAKE WITH MEALS.).    Dispense:  20 tablet    Refill:  0    Order Specific Question:  Supervising Provider    Answer:  MILLER, BRIAN [[id number]]  . cyclobenzaprine (FLEXERIL) 10 MG tablet    Sig: Take 1 tablet (10 mg total) by mouth 3 (three) times daily as needed for muscle spasms.    Dispense:  15 tablet    Refill:  0    Order Specific Question:  Supervising Provider    Answer:  Eber Hong [[id number]]     Marilene Vath Camprubi-Soms, PA-C 09/12/15 1004  Laurence Spates, MD 09/13/15 [phone]

## 2015-09-12 NOTE — ED Notes (Signed)
Per [facility] patient slipped and fell down approximately 7 steps.  Denies LOC, no obvious deformity noted.  Patient complains of pain to her left neck, left wrist, and left ribs.  Patient is alert and oriented and in no apparent distress at this time.

## 2015-09-12 NOTE — Discharge Instructions (Signed)
Take naprosyn as directed for inflammation and pain with norco for breakthrough pain and flexeril for muscle relaxation. Do not drive or operate machinery with pain medication or muscle relaxant use. Ice to areas of soreness for the next 24 hours and then may move to heat, no more than 20 minutes at a time every hour for each. Use wrist brace for 1 week for stabilization of your wrist, then as needed thereafter for help with wrist pain. Expect to be sore for the next few days and follow up with your primary care physician (or [facility] if you don't have a primary care doctor) for recheck of ongoing symptoms in the next 1-2 weeks, or you can follow up with the orthopedist in the next 1-2 weeks as needed for ongoing symptoms. Return to ER for emergent changing or worsening of symptoms.     Musculoskeletal Pain Musculoskeletal pain is muscle and boney aches and pains. These pains can occur in any part of the body. Your caregiver may treat you without knowing the cause of the pain. They may treat you if blood or urine tests, X-rays, and other tests were normal.  CAUSES There is often not a definite cause or reason for these pains. These pains may be caused by a type of germ (virus). The discomfort may also come from overuse. Overuse includes working out too hard when your body is not fit. Boney aches also come from weather changes. Bone is sensitive to atmospheric pressure changes. HOME CARE INSTRUCTIONS   Ask when your test results will be ready. Make sure you get your test results.  Only take over-the-counter or prescription medicines for pain, discomfort, or fever as directed by your caregiver. If you were given medications for your condition, do not drive, operate machinery or power tools, or sign legal documents for 24 hours. Do not drink alcohol. Do not take sleeping pills or other medications that may interfere with treatment.  Continue all activities unless the activities cause more  pain. When the pain lessens, slowly resume normal activities. Gradually increase the intensity and duration of the activities or exercise.  During periods of severe pain, bed rest may be helpful. Lay or sit in any position that is comfortable.  Putting ice on the injured area.  Put ice in a bag.  Place a towel between your skin and the bag.  Leave the ice on for 15 to 20 minutes, 3 to 4 times a day.  Follow up with your caregiver for continued problems and no reason can be found for the pain. If the pain becomes worse or does not go away, it may be necessary to repeat tests or do additional testing. Your caregiver may need to look further for a possible cause. SEEK IMMEDIATE MEDICAL CARE IF:  You have pain that is getting worse and is not relieved by medications.  You develop chest pain that is associated with shortness or breath, sweating, feeling sick to your stomach (nauseous), or throw up (vomit).  Your pain becomes localized to the abdomen.  You develop any new symptoms that seem different or that concern you. MAKE SURE YOU:   Understand these instructions.  Will watch your condition.  Will get help right away if you are not doing well or get worse.   This information is not intended to replace advice given to you by your health care provider. Make sure you discuss any questions you have with your health care provider.   Document Released: 06/20/2005  Document Revised: 09/12/2011 Document Reviewed: 02/22/2013 Elsevier Interactive Patient Education 2016 [employer]  Rib Contusion A rib contusion is a deep bruise on your rib area. Contusions are the result of a blunt trauma that causes bleeding and injury to the tissues under the skin. A rib contusion may involve bruising of the ribs and of the skin and muscles in the area. The skin overlying the contusion may turn blue, purple, or yellow. Minor injuries will give you a painless contusion, but more severe contusions may stay  painful and swollen for a few weeks. CAUSES  A contusion is usually caused by a blow, trauma, or direct force to an area of the body. This often occurs while playing contact sports. SYMPTOMS  Swelling and redness of the injured area.  Discoloration of the injured area.  Tenderness and soreness of the injured area.  Pain with or without movement. DIAGNOSIS  The diagnosis can be made by taking a medical history and performing a physical exam. An X-ray, CT scan, or MRI may be needed to determine if there were any associated injuries, such as broken bones (fractures) or internal injuries. TREATMENT  Often, the best treatment for a rib contusion is rest. Icing or applying cold compresses to the injured area may help reduce swelling and inflammation. Deep breathing exercises may be recommended to reduce the risk of partial lung collapse and pneumonia. Over-the-counter or prescription medicines may also be recommended for pain control. HOME CARE INSTRUCTIONS   Apply ice to the injured area:  Put ice in a plastic bag.  Place a towel between your skin and the bag.  Leave the ice on for 20 minutes, 2-3 times per day.  Take medicines only as directed by your health care provider.  Rest the injured area. Avoid strenuous activity and any activities or movements that cause pain. Be careful during activities and avoid bumping the injured area.  Perform deep-breathing exercises as directed by your health care provider.  Do not lift anything that is heavier than 5 lb (2.3 kg) until your health care provider approves.  Do not use any tobacco products, including cigarettes, chewing tobacco, or electronic cigarettes. If you need help quitting, ask your health care provider. SEEK MEDICAL CARE IF:   You have increased bruising or swelling.  You have pain that is not controlled with treatment.  You have a fever. SEEK IMMEDIATE MEDICAL CARE IF:   You have difficulty breathing or shortness of  breath.  You develop a continual cough, or you cough up thick or bloody sputum.  You feel sick to your stomach (nauseous), you throw up (vomit), or you have abdominal pain.   This information is not intended to replace advice given to you by your health care provider. Make sure you discuss any questions you have with your health care provider.   Document Released: 03/15/2001 Document Revised: 07/11/2014 Document Reviewed: 04/01/2014 Elsevier Interactive Patient Education 2016 [employer]  Wrist Sprain A wrist sprain is a stretch or tear in the strong, fibrous tissues (ligaments) that connect your wrist bones. The ligaments of your wrist may be easily sprained. There are three types of wrist sprains.  Grade 1. The ligament is not stretched or torn, but the sprain causes pain.  Grade 2. The ligament is stretched or partially torn. You may be able to move your wrist, but not very much.  Grade 3. The ligament or muscle completely tears. You may find it difficult or extremely painful to move your wrist even  a little. CAUSES Often, wrist sprains are a result of a fall or an injury. The force of the impact causes the fibers of your ligament to stretch too much or tear. Common causes of wrist sprains include:  Overextending your wrist while catching a ball with your hands.  Repetitive or strenuous extension or bending of your wrist.  Landing on your hand during a fall. RISK FACTORS  Having previous wrist injuries.  Playing contact sports, such as boxing or wrestling.  Participating in activities in which falling is common.  Having poor wrist strength and flexibility. SIGNS AND SYMPTOMS  Wrist pain.  Wrist tenderness.  Inflammation or bruising of the wrist area.  Hearing a "pop" or feeling a tear at the time of the injury.  Decreased wrist movement due to pain, stiffness, or weakness. DIAGNOSIS Your health care provider will examine your wrist. In some cases, an X-ray will  be taken to make sure you did not break any bones. If your health care provider thinks that you tore a ligament, he or she may order an MRI of your wrist. TREATMENT Treatment involves resting and icing your wrist. You may also need to take pain medicines to help lessen pain and inflammation. Your health care provider may recommend keeping your wrist still (immobilized) with a splint to help your sprain heal. When the splint is no longer necessary, you may need to perform strengthening and stretching exercises. These exercises help you to regain strength and full range of motion in your wrist. Surgery is not usually needed for wrist sprains unless the ligament completely tears. HOME CARE INSTRUCTIONS  Rest your wrist. Do not do things that cause pain.  Wear your wrist splint as directed by your health care provider.  Take medicines only as directed by your health care provider.  To ease pain and swelling, apply ice to the injured area.  Put ice in a plastic bag.  Place a towel between your skin and the bag.  Leave the ice on for 20 minutes, 2-3 times a day. SEEK MEDICAL CARE IF:  Your pain, discomfort, or swelling gets worse even with treatment.  You feel sudden numbness in your hand.   This information is not intended to replace advice given to you by your health care provider. Make sure you discuss any questions you have with your health care provider.   Document Released: 02/21/2014 Document Reviewed: 02/21/2014 Elsevier Interactive Patient Education 2016 [employer]  Hip Pain Your hip is the joint between your upper legs and your lower pelvis. The bones, cartilage, tendons, and muscles of your hip joint perform a lot of work each day supporting your body weight and allowing you to move around. Hip pain can range from a minor ache to severe pain in one or both of your hips. Pain may be felt on the inside of the hip joint near the groin, or the outside near the buttocks and upper  thigh. You may have swelling or stiffness as well.  HOME CARE INSTRUCTIONS   Take medicines only as directed by your health care provider.  Apply ice to the injured area:  Put ice in a plastic bag.  Place a towel between your skin and the bag.  Leave the ice on for 15-20 minutes at a time, 3-4 times a day.  Keep your leg raised (elevated) when possible to lessen swelling.  Avoid activities that cause pain.  Follow specific exercises as directed by your health care provider.  Sleep with a  pillow between your legs on your most comfortable side.  Record how often you have hip pain, the location of the pain, and what it feels like. SEEK MEDICAL CARE IF:   You are unable to put weight on your leg.  Your hip is red or swollen or very tender to touch.  Your pain or swelling continues or worsens after 1 week.  You have increasing difficulty walking.  You have a fever. SEEK IMMEDIATE MEDICAL CARE IF:   You have fallen.  You have a sudden increase in pain and swelling in your hip. MAKE SURE YOU:   Understand these instructions.  Will watch your condition.  Will get help right away if you are not doing well or get worse.   This information is not intended to replace advice given to you by your health care provider. Make sure you discuss any questions you have with your health care provider.   Document Released: 12/08/2009 Document Revised: 07/11/2014 Document Reviewed: 02/14/2013 Elsevier Interactive Patient Education 2016 [employer]  Cryotherapy Cryotherapy is when you put ice on your injury. Ice helps lessen pain and puffiness (swelling) after an injury. Ice works the best when you start using it in the first 24 to 48 hours after an injury. HOME CARE  Put a dry or damp towel between the ice pack and your skin.  You may press gently on the ice pack.  Leave the ice on for no more than 10 to 20 minutes at a time.  Check your skin after 5 minutes to make sure your  skin is okay.  Rest at least 20 minutes between ice pack uses.  Stop using ice when your skin loses feeling (numbness).  Do not use ice on someone who cannot tell you when it hurts. This includes small children and people with memory problems (dementia). GET HELP RIGHT AWAY IF:  You have white spots on your skin.  Your skin turns blue or pale.  Your skin feels waxy or hard.  Your puffiness gets worse. MAKE SURE YOU:   Understand these instructions.  Will watch your condition.  Will get help right away if you are not doing well or get worse.   This information is not intended to replace advice given to you by your health care provider. Make sure you discuss any questions you have with your health care provider.   Document Released: 12/07/2007 Document Revised: 09/12/2011 Document Reviewed: 02/10/2011 Elsevier Interactive Patient Education 2016 [employer] Inc.  [employer] Therapy Heat therapy can help ease sore, stiff, injured, and tight muscles and joints. Heat relaxes your muscles, which may help ease your pain. Heat therapy should only be used on old, pre-existing, or long-lasting (chronic) injuries. Do not use heat therapy unless told by your doctor. HOW TO USE HEAT THERAPY There are several different kinds of heat therapy, including:  Moist heat pack.  Warm water bath.  Hot water bottle.  Electric heating pad.  Heated gel pack.  Heated wrap.  Electric heating pad. GENERAL HEAT THERAPY RECOMMENDATIONS   Do not sleep while using heat therapy. Only use heat therapy while you are awake.  Your skin may turn pink while using heat therapy. Do not use heat therapy if your skin turns red.  Do not use heat therapy if you have new pain.  High heat or long exposure to heat can cause burns. Be careful when using heat therapy to avoid burning your skin.  Do not use heat therapy on areas of your skin  that are already irritated, such as with a rash or sunburn. GET HELP IF:    You have blisters, redness, swelling (puffiness), or numbness.  You have new pain.  Your pain is worse. MAKE SURE YOU:  Understand these instructions.  Will watch your condition.  Will get help right away if you are not doing well or get worse.   This information is not intended to replace advice given to you by your health care provider. Make sure you discuss any questions you have with your health care provider.   Document Released: 09/12/2011 Document Revised: 07/11/2014 Document Reviewed: 08/13/2013 Elsevier Interactive Patient Education [employer]

## 2015-09-12 NOTE — Progress Notes (Signed)
Orthopedic Tech Progress Note Patient Details:  Karina Mueller January 08, 1961 [id number]  Ortho Devices Type of Ortho Device: Thumb velcro splint Ortho Device/Splint Interventions: Application   Saul FordyceJennifer C Maely Clements 09/12/2015, 10:50 AM

## 2015-12-18 ENCOUNTER — Emergency Department (HOSPITAL_COMMUNITY): Payer: BLUE CROSS/BLUE SHIELD

## 2015-12-18 ENCOUNTER — Encounter (HOSPITAL_COMMUNITY): Payer: Self-pay | Admitting: *Deleted

## 2015-12-18 ENCOUNTER — Emergency Department (HOSPITAL_COMMUNITY)
Admission: EM | Admit: 2015-12-18 | Discharge: 2015-12-18 | Disposition: A | Discharge status: Home / Self Care | Payer: BLUE CROSS/BLUE SHIELD | Attending: Emergency Medicine | Admitting: Emergency Medicine

## 2015-12-18 DIAGNOSIS — R1011 Right upper quadrant pain: Secondary | ICD-10-CM | POA: Insufficient documentation

## 2015-12-18 DIAGNOSIS — K2980 Duodenitis without bleeding: Secondary | ICD-10-CM

## 2015-12-18 DIAGNOSIS — R1013 Epigastric pain: Secondary | ICD-10-CM

## 2015-12-18 LAB — URINALYSIS, ROUTINE W REFLEX MICROSCOPIC
Bilirubin Urine: NEGATIVE
GLUCOSE, UA: NEGATIVE mg/dL
KETONES UR: NEGATIVE mg/dL
Leukocytes, UA: NEGATIVE
Nitrite: NEGATIVE
PH: 7 (ref 5.0–8.0)
Protein, ur: NEGATIVE mg/dL
Specific Gravity, Urine: 1.019 (ref 1.005–1.030)

## 2015-12-18 LAB — LIPASE, BLOOD: Lipase: 42 U/L (ref 11–51)

## 2015-12-18 LAB — URINE MICROSCOPIC-ADD ON
Bacteria, UA: NONE SEEN
WBC UA: NONE SEEN WBC/hpf (ref 0–5)

## 2015-12-18 LAB — COMPREHENSIVE METABOLIC PANEL
ALK PHOS: 82 U/L (ref 38–126)
ALT: 18 U/L (ref 14–54)
AST: 15 U/L (ref 15–41)
Albumin: 3.5 g/dL (ref 3.5–5.0)
Anion gap: 6 (ref 5–15)
BUN: <5 mg/dL — ABNORMAL LOW (ref 6–20)
CALCIUM: 9.1 mg/dL (ref 8.9–10.3)
CO2: 26 mmol/L (ref 22–32)
CREATININE: 0.82 mg/dL (ref 0.44–1.00)
Chloride: 102 mmol/L (ref 101–111)
GFR calc non Af Amer: >60 mL/min (ref >60)
GLUCOSE: 116 mg/dL — AB (ref 65–99)
Potassium: 3.6 mmol/L (ref 3.5–5.1)
SODIUM: 134 mmol/L — AB (ref 135–145)
Total Bilirubin: 0.3 mg/dL (ref 0.3–1.2)
Total Protein: 6.9 g/dL (ref 6.5–8.1)

## 2015-12-18 LAB — CBC
HCT: 38 % (ref 36.0–46.0)
Hemoglobin: 13 g/dL (ref 12.0–15.0)
MCH: 28.9 pg (ref 26.0–34.0)
MCHC: 34.2 g/dL (ref 30.0–36.0)
MCV: 84.4 fL (ref 78.0–100.0)
PLATELETS: 330 10*3/uL (ref 150–400)
RBC: 4.5 MIL/uL (ref 3.87–5.11)
RDW: 14.1 % (ref 11.5–15.5)
WBC: 13.3 10*3/uL — ABNORMAL HIGH (ref 4.0–10.5)

## 2015-12-18 LAB — I-STAT TROPONIN, ED: TROPONIN I, POC: 0 ng/mL (ref 0.00–0.08)

## 2015-12-18 MED ORDER — TRAMADOL HCL 50 MG PO TABS: 50.0000 mg | ORAL_TABLET | Freq: Four times a day (QID) | ORAL | Status: DC | PRN

## 2015-12-18 MED ORDER — METRONIDAZOLE 500 MG PO TABS: 500.0000 mg | ORAL_TABLET | Freq: Two times a day (BID) | ORAL | Status: DC

## 2015-12-18 MED ORDER — ALUM & MAG HYDROXIDE-SIMETH 200-200-20 MG/5ML PO SUSP: 15.0000 mL | Freq: Once | ORAL | Status: DC

## 2015-12-18 MED ORDER — CIPROFLOXACIN HCL 500 MG PO TABS: 500.0000 mg | ORAL_TABLET | Freq: Two times a day (BID) | ORAL | Status: DC

## 2015-12-18 MED ORDER — SODIUM CHLORIDE 0.9 % IV BOLUS (SEPSIS)
1000.0000 mL | Freq: Once | INTRAVENOUS | Status: AC
  Administered 2015-12-18: 1000 mL via INTRAVENOUS

## 2015-12-18 MED ORDER — MORPHINE SULFATE (PF) 4 MG/ML IV SOLN
4.0000 mg | Freq: Once | INTRAVENOUS | Status: AC
  Administered 2015-12-18: 4 mg via INTRAVENOUS
  Filled 2015-12-18: qty 1

## 2015-12-18 MED ORDER — IOPAMIDOL (ISOVUE-300) INJECTION 61%
INTRAVENOUS | Status: AC
  Administered 2015-12-18: 100 mL via INTRAVENOUS
  Filled 2015-12-18: qty 100

## 2015-12-18 MED ORDER — LIDOCAINE VISCOUS 2 % MT SOLN: 15.0000 mL | Freq: Once | OROMUCOSAL | Status: DC

## 2015-12-18 NOTE — ED Notes (Signed)
Patient transported to Ultrasound 

## 2015-12-18 NOTE — Discharge Instructions (Signed)
1. Duodenitis on CT of your abdomen. 2. 2.5 x 5.3 x 3.9 cm complex multi cystic lesion within the right pelvic cul-de-sac, indeterminate. Further evaluation with dedicated pelvic ultrasound recommended. - FOLLOW UP WITH YOUR PCP FOR THIS. 3. 5 mm right lower lobe nodule, indeterminate. No follow-up needed if patient is low-risk. Non-contrast chest CT can be considered in 12 months if patient is high-risk.  - FOLLOW UP WITH PCP FOR THIS.  See GI for follow up.   Take cipro and flagyl as prescribed.  Take tramadol for severe pain.   Return to ER if you have worse abdominal pain, vomiting, fever  Abdominal Pain, Adult Many things can cause abdominal pain. Usually, abdominal pain is not caused by a disease and will improve without treatment. It can often be observed and treated at home. Your health care provider will do a physical exam and possibly order blood tests and X-rays to help determine the seriousness of your pain. However, in many cases, more time must pass before a clear cause of the pain can be found. Before that point, your health care provider may not know if you need more testing or further treatment. HOME CARE INSTRUCTIONS Monitor your abdominal pain for any changes. The following actions may help to alleviate any discomfort you are experiencing:  Only take over-the-counter or prescription medicines as directed by your health care provider.  Do not take laxatives unless directed to do so by your health care provider.  Try a clear liquid diet (broth, tea, or water) as directed by your health care provider. Slowly move to a bland diet as tolerated. SEEK MEDICAL CARE IF:  You have unexplained abdominal pain.  You have abdominal pain associated with nausea or diarrhea.  You have pain when you urinate or have a bowel movement.  You experience abdominal pain that wakes you in the night.  You have abdominal pain that is worsened or improved by eating food.  You have  abdominal pain that is worsened with eating fatty foods.  You have a fever. SEEK IMMEDIATE MEDICAL CARE IF:  Your pain does not go away within 2 hours.  You keep throwing up (vomiting).  Your pain is felt only in portions of the abdomen, such as the right side or the left lower portion of the abdomen.  You pass bloody or black tarry stools. MAKE SURE YOU:  Understand these instructions.  Will watch your condition.  Will get help right away if you are not doing well or get worse.   This information is not intended to replace advice given to you by your health care provider. Make sure you discuss any questions you have with your health care provider.   Document Released: 03/30/2005 Document Revised: 03/11/2015 Document Reviewed: 02/27/2013 Elsevier Interactive Patient Education [employer]

## 2015-12-18 NOTE — ED Provider Notes (Signed)
CSN: [id number]     Arrival date & time 12/18/15  1635 History   First MD Initiated Contact with Patient 12/18/15 1932     Chief Complaint  Patient presents with  . Abdominal Pain   HPI Yesterday began having moderate midepigastric abdominal pain. Pain is sharp, moves to the right. She was having food when the pain started and it remained constant since. She has not treated the pain with anything. She still has her gallbladder and her appendix. She feels hot but no fevers. No diarrhea. Does state some mild pain when she urinates but not like prior urinary tract infections and no history of kidney stones and no hematuria. She does have some right upper quadrant pain as well that radiates to the back. She does not feel lightheaded does not have chest pain or shortness of breath. Her only abdominal surgeries are hysterectomy. She has taken naproxen in the past but none recently. No known history of ulcers. She has not vomited but has had decreased by mouth intake today. Does not have any history of coronary artery disease and is not a diabetic. No rectal bleeding, vaginal symptoms, no black stool.  History reviewed. No pertinent past medical history. Past Surgical History  Procedure Laterality Date  . Abdominal hysterectomy     History reviewed. No pertinent family history. Social History  Substance Use Topics  . Smoking status: Never Smoker   . Smokeless tobacco: None  . Alcohol Use: No   OB History    No data available     Review of Systems  Constitutional: Negative for fever.  Allergic/Immunologic: Negative for immunocompromised state.  All other systems reviewed and are negative.     Allergies  Review of patient's allergies indicates no known allergies.  Home Medications   Prior to Admission medications   Medication Sig Start Date End Date Taking? Authorizing Provider  azithromycin (ZITHROMAX) 250 MG tablet Take 2 tabs PO x 1 dose, then 1 tab PO QD x 4 days Patient not  taking: Reported on 12/18/2015 08/30/14   Dorna Leitz, PA-C  benzonatate (TESSALON) 100 MG capsule Take 1-2 capsules (100-200 mg total) by mouth 3 (three) times daily as needed for cough. Patient not taking: Reported on 12/18/2015 08/30/14   Dorna Leitz, PA-C  chlorpheniramine-HYDROcodone [facility] PENNKINETIC ER) 10-8 MG/5ML LQCR Take 5 mLs by mouth every 12 (twelve) hours as needed for cough (cough). Patient not taking: Reported on 12/18/2015 08/30/14   Dorna Leitz, PA-C  ciprofloxacin (CIPRO) 500 MG tablet Take 1 tablet (500 mg total) by mouth 2 (two) times daily. One po bid x 7 days 12/18/15   Richardean Canal, MD  cyclobenzaprine (FLEXERIL) 10 MG tablet Take 1 tablet (10 mg total) by mouth 3 (three) times daily as needed for muscle spasms. Patient not taking: Reported on 12/18/2015 09/12/15   Mercedes Camprubi-Soms, PA-C  HYDROcodone-acetaminophen (NORCO) 5-325 MG tablet Take 1-2 tablets by mouth every 6 (six) hours as needed for severe pain. Patient not taking: Reported on 12/18/2015 09/12/15   Mercedes Camprubi-Soms, PA-C  metroNIDAZOLE (FLAGYL) 500 MG tablet Take 1 tablet (500 mg total) by mouth 2 (two) times daily. One po bid x 7 days 12/18/15   Richardean Canal, MD  naproxen (NAPROSYN) 500 MG tablet Take 1 tablet (500 mg total) by mouth 2 (two) times daily as needed for mild pain, moderate pain or headache (TAKE WITH MEALS.). Patient not taking: Reported on 12/18/2015 09/12/15   Mercedes Camprubi-Soms, PA-C  traMADol (  ULTRAM) 50 MG tablet Take 1 tablet (50 mg total) by mouth every 6 (six) hours as needed. 12/18/15   Richardean Canal, MD   BP 113/78 mmHg  Pulse 94  Temp(Src) 99.8 F (37.7 C) (Oral)  Resp 24  Wt 78.2 kg  SpO2 97% Physical Exam  Constitutional: She is oriented to person, place, and time. She appears well-developed and well-nourished. No distress.  HENT:  Head: Normocephalic and atraumatic.  Eyes: Conjunctivae are normal. Right eye exhibits no discharge. Left eye exhibits no discharge.   Neck: Normal range of motion. Neck supple.  Cardiovascular: Normal rate and regular rhythm.   Pulmonary/Chest: Effort normal and breath sounds normal. No respiratory distress.  Abdominal: Soft. Bowel sounds are normal. She exhibits no distension and no mass. There is tenderness (epigastric, RUQ). There is guarding (RUQ, epigastric). There is no rebound.  Neg cvat pos murphys sign  Musculoskeletal: She exhibits no edema.  Neurological: She is alert and oriented to person, place, and time.  Skin: Skin is warm. No rash noted.  Psychiatric: She has a normal mood and affect.  Nursing note and vitals reviewed.    ED Course  Procedures (including critical care time) Labs Review Labs Reviewed  COMPREHENSIVE METABOLIC PANEL - Abnormal; Notable for the following:    Sodium 134 (*)    Glucose, Bld 116 (*)    BUN <5 (*)    All other components within normal limits  CBC - Abnormal; Notable for the following:    WBC 13.3 (*)    All other components within normal limits  URINALYSIS, ROUTINE W REFLEX MICROSCOPIC (NOT AT [facility]) - Abnormal; Notable for the following:    Hgb urine dipstick SMALL (*)    All other components within normal limits  URINE MICROSCOPIC-ADD ON - Abnormal; Notable for the following:    Squamous Epithelial / LPF 0-5 (*)    All other components within normal limits  URINE CULTURE  LIPASE, BLOOD  I-STAT TROPOININ, ED    Imaging Review Ct Abdomen Pelvis W Contrast  12/18/2015  CLINICAL DATA:  Initial evaluation for acute moderate mid abdominal pain since yesterday. Nausea. EXAM: CT ABDOMEN AND PELVIS WITH CONTRAST TECHNIQUE: Multidetector CT imaging of the abdomen and pelvis was performed using the standard protocol following bolus administration of intravenous contrast. CONTRAST:  100 cc ISOVUE-300 IOPAMIDOL (ISOVUE-300) INJECTION 61% COMPARISON:  Prior ultrasound from earlier the same day. FINDINGS: Mild subsegmental atelectasis seen dependently within the visualized lung  bases. Single 5 mm nodule within the peripheral right lower lobe (series 3, image 9), indeterminate. Subcentimeter hypodensity within the left hepatic lobe noted, too small the characterize. Liver otherwise unremarkable. Gallbladder within normal limits. No biliary dilatation. Spleen and adrenal glands within normal limits. There is mild hazy stranding about the head and uncinate process of the pancreas, which may reflect sequela of acute pancreatitis. Alternatively, this may be associated with the adjacent duodenum, and reflect acute duodenitis. Kidneys equal in size with symmetric enhancement. Few small subcentimeter hypodensities noted within the bilateral kidneys, too small the characterize, but statistically likely reflect small cysts. No nephrolithiasis, hydronephrosis, or focal enhancing renal mass. Stomach within normal limits. As mentioned previously, there is suggestion of mild hazy stranding about the third-fourth portion of the duodenum, which may reflect acute duodenitis. Small bowel itself of normal caliber without evidence for obstruction. Appendix normal. No other acute inflammatory changes seen about the bowels. Mild circumferential bladder wall thickening favored to be related incomplete distension. Bladder are otherwise unremarkable. Uterus  is absent. Complex multi cystic lesion measuring approximately 2.5 x 5.3 x 3.9 cm present within the posterior right pelvic cul-de-sac (Series 2, image 67). This is indeterminate. Left ovary not discretely visualized. No free air. Possible trace free fluid within the pelvis. No pathologically enlarged intra-abdominal or pelvic lymph nodes identified. 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 No acute osseous abnormality. No worrisome lytic or blastic osseous lesions. IMPRESSION: 1. Mild hazy fat stranding about the third portion of the duodenum, which may reflect acute duodenitis. Possible mild acute pancreatitis could also be  considered, as these inflammatory changes closely approximate the pancreatic head and uncinate process. Correlation with serum lipase recommended. 2. No other acute intra-abdominal or pelvic process identified. 3. 2.5 x 5.3 x 3.9 cm complex multi cystic lesion within the right pelvic cul-de-sac, indeterminate. Further evaluation with dedicated pelvic ultrasound recommended. 4. 5 mm chronic spondylolisthesis of L5 on S1. 5. 5 mm right lower lobe nodule, indeterminate. No follow-up needed if patient is low-risk. Non-contrast chest CT can be considered in 12 months if patient is high-risk. This recommendation follows the consensus statement: Guidelines for Management of Incidental Pulmonary Nodules Detected on CT Images:From the Fleischner Society 2017; published online before print (10.1148/[phone]). Electronically Signed   By: Rise MuBenjamin  McClintock M.D.   On: 12/18/2015 21:42   [place] Abdomen Limited Ruq  12/18/2015  CLINICAL DATA:  Epigastric and mid abdominal pain for 1 day. Nausea. EXAM: US ABDOMEN LIMITED - RIGHT UPPER QUADRANT COMPARISON:  None. FINDINGS: Gallbladder: Physiologically distended. No gallstones or wall thickening visualized. No sonographic Murphy sign noted by sonographer. Common bile duct: Diameter: 2.5 mm proximally. Liver: No focal lesion identified. Within normal limits in parenchymal echogenicity. Normal directional flow in the imaged portal vein. IMPRESSION: Normal right upper quadrant ultrasound. Electronically Signed   By: Rubye OaksMelanie  Ehinger M.D.   On: 12/18/2015 20:29   I have personally reviewed and evaluated these images and lab results as part of my medical decision-making.   EKG Interpretation   Date/Time:  Friday December 18 2015 19:46:59 EDT Ventricular Rate:  83 PR Interval:  165 QRS Duration: 99 QT Interval:  361 QTC Calculation: 424 R Axis:   76 Text Interpretation:  Sinus rhythm RSR' in V1 or V2, right VCD or RVH No  significant change since last tracing  Confirmed by YAO  MD, DAVID ([id number])  on 12/18/2015 8:03:57 PM      MDM   Final diagnoses:  Epigastric abdominal pain  Duodenitis    Negative US for cholecystitis. CT proven duodenitis. Cipro/flagyl. Return precautions, pt verbalized understanding. DC in good condition.     Sidney AceAlison Charruf Kupono Marling, MD 12/19/15 0124  Richardean Canalavid H Yao, MD 12/19/15 2232

## 2015-12-18 NOTE — ED Notes (Addendum)
Pt reports onset yesterday of moderate mid abd pain. Having nausea today, denies vomiting or diarrhea.

## 2015-12-20 LAB — URINE CULTURE: Culture: NO GROWTH

## 2016-09-21 ENCOUNTER — Other Ambulatory Visit: Payer: Self-pay | Admitting: Internal Medicine

## 2016-10-03 ENCOUNTER — Other Ambulatory Visit: Payer: Self-pay | Admitting: Internal Medicine

## 2016-10-03 DIAGNOSIS — R1032 Left lower quadrant pain: Secondary | ICD-10-CM

## 2016-10-06 ENCOUNTER — Other Ambulatory Visit: Payer: BLUE CROSS/BLUE SHIELD

## 2016-11-23 ENCOUNTER — Emergency Department (HOSPITAL_COMMUNITY): Payer: BLUE CROSS/BLUE SHIELD

## 2016-11-23 ENCOUNTER — Emergency Department (HOSPITAL_COMMUNITY)
Admission: EM | Admit: 2016-11-23 | Discharge: 2016-11-24 | Disposition: A | Discharge status: Home / Self Care | Payer: BLUE CROSS/BLUE SHIELD | Attending: Emergency Medicine | Admitting: Emergency Medicine

## 2016-11-23 ENCOUNTER — Encounter (HOSPITAL_COMMUNITY): Payer: Self-pay | Admitting: Emergency Medicine

## 2016-11-23 DIAGNOSIS — M545 Low back pain: Secondary | ICD-10-CM | POA: Insufficient documentation

## 2016-11-23 DIAGNOSIS — Z79899 Other long term (current) drug therapy: Secondary | ICD-10-CM | POA: Insufficient documentation

## 2016-11-23 DIAGNOSIS — K2980 Duodenitis without bleeding: Secondary | ICD-10-CM

## 2016-11-23 LAB — CBC
HCT: 38.7 % (ref 36.0–46.0)
Hemoglobin: 13 g/dL (ref 12.0–15.0)
MCH: 28.5 pg (ref 26.0–34.0)
MCHC: 33.6 g/dL (ref 30.0–36.0)
MCV: 84.9 fL (ref 78.0–100.0)
PLATELETS: 316 10*3/uL (ref 150–400)
RBC: 4.56 MIL/uL (ref 3.87–5.11)
RDW: 13.8 % (ref 11.5–15.5)
WBC: 12.7 10*3/uL — AB (ref 4.0–10.5)

## 2016-11-23 LAB — COMPREHENSIVE METABOLIC PANEL
ALT: 21 U/L (ref 14–54)
ANION GAP: 8 (ref 5–15)
AST: 19 U/L (ref 15–41)
Albumin: 3.8 g/dL (ref 3.5–5.0)
Alkaline Phosphatase: 87 U/L (ref 38–126)
BUN: 7 mg/dL (ref 6–20)
CHLORIDE: 105 mmol/L (ref 101–111)
CO2: 25 mmol/L (ref 22–32)
CREATININE: 0.79 mg/dL (ref 0.44–1.00)
Calcium: 9.4 mg/dL (ref 8.9–10.3)
GFR calc Af Amer: >60 mL/min (ref >60)
Glucose, Bld: 118 mg/dL — ABNORMAL HIGH (ref 65–99)
Potassium: 4 mmol/L (ref 3.5–5.1)
Sodium: 138 mmol/L (ref 135–145)
Total Bilirubin: 0.8 mg/dL (ref 0.3–1.2)
Total Protein: 7.1 g/dL (ref 6.5–8.1)

## 2016-11-23 LAB — URINALYSIS, ROUTINE W REFLEX MICROSCOPIC
Bilirubin Urine: NEGATIVE
Glucose, UA: NEGATIVE mg/dL
Ketones, ur: NEGATIVE mg/dL
LEUKOCYTES UA: NEGATIVE
Nitrite: NEGATIVE
PH: 6 (ref 5.0–8.0)
Protein, ur: NEGATIVE mg/dL
SQUAMOUS EPITHELIAL / LPF: NONE SEEN
Specific Gravity, Urine: 1.011 (ref 1.005–1.030)

## 2016-11-23 LAB — LIPASE, BLOOD: LIPASE: 49 U/L (ref 11–51)

## 2016-11-23 MED ORDER — GI COCKTAIL ~~LOC~~
30.0000 mL | Freq: Once | ORAL | Status: AC
  Administered 2016-11-23: 30 mL via ORAL
  Filled 2016-11-23: qty 30

## 2016-11-23 MED ORDER — RANITIDINE HCL 150 MG PO CAPS: 150.0000 mg | ORAL_CAPSULE | Freq: Every day | ORAL | 0 refills | Status: AC

## 2016-11-23 MED ORDER — SODIUM CHLORIDE 0.9 % IV BOLUS (SEPSIS)
1000.0000 mL | Freq: Once | INTRAVENOUS | Status: AC
  Administered 2016-11-23: 1000 mL via INTRAVENOUS

## 2016-11-23 MED ORDER — ONDANSETRON HCL 4 MG/2ML IJ SOLN
4.0000 mg | Freq: Once | INTRAMUSCULAR | Status: AC
  Administered 2016-11-23: 4 mg via INTRAVENOUS
  Filled 2016-11-23: qty 2

## 2016-11-23 MED ORDER — MORPHINE SULFATE (PF) 4 MG/ML IV SOLN
4.0000 mg | Freq: Once | INTRAVENOUS | Status: AC
  Administered 2016-11-23: 4 mg via INTRAVENOUS
  Filled 2016-11-23: qty 1

## 2016-11-23 MED ORDER — SUCRALFATE 1 G PO TABS: 1.0000 g | ORAL_TABLET | Freq: Three times a day (TID) | ORAL | 0 refills | Status: AC

## 2016-11-23 MED ORDER — IOPAMIDOL (ISOVUE-300) INJECTION 61%
INTRAVENOUS | Status: AC
  Administered 2016-11-23: 100 mL
  Filled 2016-11-23: qty 100

## 2016-11-23 MED ORDER — OMEPRAZOLE 20 MG PO CPDR: 20.0000 mg | DELAYED_RELEASE_CAPSULE | Freq: Every day | ORAL | 0 refills | Status: AC

## 2016-11-23 NOTE — ED Notes (Signed)
Patient transported to CT 

## 2016-11-23 NOTE — ED Notes (Signed)
Pt to US.

## 2016-11-23 NOTE — ED Triage Notes (Signed)
Pt sts mid abd pain with radiation to back x 2 days

## 2016-11-23 NOTE — ED Provider Notes (Signed)
[facility]-EMERGENCY DEPT Provider Note   CSN: [id number] Arrival date & time: 11/23/16  1210     History   Chief Complaint Chief Complaint  Patient presents with  . Abdominal Pain    HPI Karina Mueller is a 56 y.o. female.  HPI   56 year old female prior history of abdominal hysterectomy presenting complaining of abdominal pain. Patient report for the past 2 days she has had persistent epigastric abdominal pain. She described the pain as a soreness sensation, moderate in intensity, nothing seems to make it better or worse. She endorse nausea without vomiting. Pain this radiates towards her back. She has an appetite but afraid to eat. She states drinking does not bother her. When the pain is severe, she endorses shortness of breath. She did try Prilosec, and ginger ale with minimal improvement. She felt that this is different from heartburn. No reported fever, chills, headache, chest pain, productive cough, dysuria, hematuria, bowel bladder incontinence or saddle anesthesia. She has normal bowel movement. Denies alcohol or drug use.  History reviewed. No pertinent past medical history.  There are no active problems to display for this patient.   Past Surgical History:  Procedure Laterality Date  . ABDOMINAL HYSTERECTOMY      OB History    No data available       Home Medications    Prior to Admission medications   Medication Sig Start Date End Date Taking? Authorizing Provider  azithromycin (ZITHROMAX) 250 MG tablet Take 2 tabs PO x 1 dose, then 1 tab PO QD x 4 days Patient not taking: Reported on 12/18/2015 08/30/14   Dorna Leitz, PA-C  benzonatate (TESSALON) 100 MG capsule Take 1-2 capsules (100-200 mg total) by mouth 3 (three) times daily as needed for cough. Patient not taking: Reported on 12/18/2015 08/30/14   Dorna Leitz, PA-C  chlorpheniramine-HYDROcodone [facility] PENNKINETIC ER) 10-8 MG/5ML LQCR Take 5 mLs by mouth every 12 (twelve) hours as needed for cough  (cough). Patient not taking: Reported on 12/18/2015 08/30/14   Dorna Leitz, PA-C  ciprofloxacin (CIPRO) 500 MG tablet Take 1 tablet (500 mg total) by mouth 2 (two) times daily. One po bid x 7 days 12/18/15   Charlynne Pander, MD  cyclobenzaprine (FLEXERIL) 10 MG tablet Take 1 tablet (10 mg total) by mouth 3 (three) times daily as needed for muscle spasms. Patient not taking: Reported on 12/18/2015 09/12/15   Street, [place], PA-C  HYDROcodone-acetaminophen (NORCO) 5-325 MG tablet Take 1-2 tablets by mouth every 6 (six) hours as needed for severe pain. Patient not taking: Reported on 12/18/2015 09/12/15   Street, [place], PA-C  metroNIDAZOLE (FLAGYL) 500 MG tablet Take 1 tablet (500 mg total) by mouth 2 (two) times daily. One po bid x 7 days 12/18/15   Charlynne Pander, MD  naproxen (NAPROSYN) 500 MG tablet Take 1 tablet (500 mg total) by mouth 2 (two) times daily as needed for mild pain, moderate pain or headache (TAKE WITH MEALS.). Patient not taking: Reported on 12/18/2015 09/12/15   Street, [place], PA-C  traMADol (ULTRAM) 50 MG tablet Take 1 tablet (50 mg total) by mouth every 6 (six) hours as needed. 12/18/15   Charlynne Pander, MD    Family History History reviewed. No pertinent family history.  Social History Social History  Substance Use Topics  . Smoking status: Never Smoker  . Smokeless tobacco: Not on file  . Alcohol use No     Allergies   Patient has no known allergies.  Review of Systems Review of Systems  All other systems reviewed and are negative.    Physical Exam Updated Vital Signs BP 112/67 (BP Location: Left Arm)   Pulse 78   Temp 99.5 F (37.5 C) (Oral)   Resp 19   SpO2 100%   Physical Exam  Constitutional: She appears well-developed and well-nourished. No distress.  Obese female appears uncomfortable but nontoxic.  HENT:  Head: Atraumatic.  Eyes: Conjunctivae are normal.  Neck: Neck supple.  Cardiovascular: Normal rate, regular rhythm and  intact distal pulses.   Pulmonary/Chest: Effort normal and breath sounds normal. No respiratory distress. She has no wheezes. She has no rales. She exhibits no tenderness.  Abdominal: Soft. She exhibits no distension. There is tenderness (Epigastric tenderness without guarding or rebound tenderness. Negative Murphy sign, no pain at McBurney's point.).  Decreased bowel sounds.  Genitourinary:  Genitourinary Comments: No CVA tenderness  Musculoskeletal:  Right lower lumbar paraspinal muscle tenderness on palpation. No symptomatic midline spine tenderness  Neurological: She is alert.  Skin: No rash noted.  Psychiatric: She has a normal mood and affect.  Nursing note and vitals reviewed.    ED Treatments / Results  Labs (all labs ordered are listed, but only abnormal results are displayed) Labs Reviewed  COMPREHENSIVE METABOLIC PANEL - Abnormal; Notable for the following:       Result Value   Glucose, Bld 118 (*)    All other components within normal limits  CBC - Abnormal; Notable for the following:    WBC 12.7 (*)    All other components within normal limits  URINALYSIS, ROUTINE W REFLEX MICROSCOPIC - Abnormal; Notable for the following:    Hgb urine dipstick SMALL (*)    Bacteria, UA RARE (*)    All other components within normal limits  LIPASE, BLOOD    EKG  EKG Interpretation None       Radiology Ct Abdomen Pelvis W Contrast  Result Date: 11/23/2016 CLINICAL DATA:  Abdominal pain for 2 days EXAM: CT ABDOMEN AND PELVIS WITH CONTRAST TECHNIQUE: Multidetector CT imaging of the abdomen and pelvis was performed using the standard protocol following bolus administration of intravenous contrast. CONTRAST:  ISOVUE-300 IOPAMIDOL (ISOVUE-300) INJECTION 61% COMPARISON:  CT abdomen pelvis 12/18/2015 FINDINGS: Lower chest: Unchanged right middle lobe 3 mm nodule and 3 mm right lower lobe nodule. Hepatobiliary: Normal hepatic size and contours without focal liver lesion. No  perihepatic ascites. No intra- or extrahepatic biliary dilatation. Normal gallbladder. Pancreas: There is mild inflammatory stranding near the pancreatic head and third portion of the duodenum. Spleen: Normal. Adrenals/Urinary Tract: Normal adrenal glands. No hydronephrosis or solid renal mass. Stomach/Bowel: There is no hiatal hernia. The stomach and duodenum are normal. There is no dilated small bowel or enteric inflammation. There is no colonic abnormality. The appendix is normal. Vascular/Lymphatic: Normal course and caliber of the major abdominal vessels. No abdominal or pelvic adenopathy. Reproductive: Lobulated cystic lesion of the right adnexa is unchanged, measuring altogether 5.9 x 2.5 cm. Status post hysterectomy. Musculoskeletal: Lower lumbar facet arthrosis without bony spinal canal stenosis. There is grade 1 L5-S1 anterolisthesis secondary to pars interarticularis defects. No lytic or blastic osseous lesion. Normal visualized extrathoracic and extraperitoneal soft tissues. Other: No contributory non-categorized findings. IMPRESSION: 1. Mild inflammatory stranding adjacent to the pancreatic head and third portion of the duodenum. The findings may indicate acute pancreatitis and/or duodenitis. Correlation with laboratory values recommended. 2. Unchanged appearance of multilobulated cystic lesion of the right adnexa. This is favored  to be benign. Follow-up pelvic ultrasound is recommended in 1 year. Electronically Signed   By: Deatra RobinsonKevin  Herman M.D.   On: 11/23/2016 21:51   [place] Abdomen Limited  Result Date: 11/23/2016 CLINICAL DATA:  Epigastric pain for 2 days. EXAM: US ABDOMEN LIMITED - RIGHT UPPER QUADRANT COMPARISON:  CT 12/18/2015 and ultrasound 12/18/2015 FINDINGS: Gallbladder: No gallstones or wall thickening visualized. No sonographic Murphy sign noted by sonographer. Common bile duct: Diameter: 0.6 cm. Liver: Liver is mildly heterogeneous. No focal abnormality. No intrahepatic biliary dilatation.  IMPRESSION: No acute abnormality. Electronically Signed   By: Richarda OverlieAdam  Henn M.D.   On: 11/23/2016 19:57    Procedures Procedures (including critical care time)  Medications Ordered in ED Medications  ondansetron (ZOFRAN) injection 4 mg (4 mg Intravenous Given 11/23/16 1705)  gi cocktail (Maalox,Lidocaine,Donnatal) (30 mLs Oral Given 11/23/16 1656)  morphine 4 MG/ML injection 4 mg (4 mg Intravenous Given 11/23/16 1707)  sodium chloride 0.9 % bolus 1,000 mL (1,000 mLs Intravenous New Bag/Given 11/23/16 1706)  morphine 4 MG/ML injection 4 mg (4 mg Intravenous Given 11/23/16 2024)  iopamidol (ISOVUE-300) 61 % injection (100 mLs  Contrast Given 11/23/16 2114)     Initial Impression / Assessment and Plan / ED Course  I have reviewed the triage vital signs and the nursing notes.  Pertinent labs & imaging results that were available during my care of the patient were reviewed by me and considered in my medical decision making (see chart for details).     BP (!) 99/56   Pulse 90   Temp 99.5 F (37.5 C) (Oral)   Resp 16   SpO2 97%    Final Clinical Impressions(s) / ED Diagnoses   Final diagnoses:  Acute duodenitis    New Prescriptions New Prescriptions   OMEPRAZOLE (PRILOSEC) 20 MG CAPSULE    Take 1 capsule (20 mg total) by mouth daily.   RANITIDINE (ZANTAC) 150 MG CAPSULE    Take 1 capsule (150 mg total) by mouth daily.   SUCRALFATE (CARAFATE) 1 G TABLET    Take 1 tablet (1 g total) by mouth 4 (four) times daily -  with meals and at bedtime.   4:15 PM Patient here with epigastric abdominal pain. Pain suggestive of gastritis and/or duodenitis. Due to Patient's symptoms, will obtain abdominal ultrasound to rule out gallbladder etiology. If negative, may consider abdominal and pelvis CT scan for further evaluation. Will provide some symptomatic treatment. Pain is atypical for ACS.  low suspicion for hepatitis. Low suspicion for aortic dissection.   10:55 PM UA without signs of urinary  tract infection, normal lipase, normal CMP, mildly elevated WBC of 12.7, abdominal ultrasound is unremarkable. An abdominal and pelvis CT scan obtained demonstrate mild inflammatory stranding adjacent to the pancreatic head and third portion of the duodenum. The finding may indicate acute hepatitis and/or duodenitis. Since patient has normal lipase, I anticipate her pain is likely due to duodenitis. Upon review prior records, patient was seen 6 months ago for this and symptoms and was diagnosed with duodenitis. She was discharged home with Cipro and Flagyl and GI specialist follow-up.  Patient states the antibiotic did not help much. She did talk to one of her GI specialist friend who gave her samples of Dexilant which helps her symptoms significantly. She, however unable to afford this medication.  I encouraged patient to follow-up with the GI specialist due to her recurrent symptoms. She may need an endoscopy and further evaluation. Patient discharged home with a PPI, H2  blocker, and Carafate. Return precaution discussed. Patient is stable for discharge.   Fayrene Helper, PA-C 11/23/16 2302    Loren Racer, MD 11/24/16 [phone]

## 2016-11-23 NOTE — Discharge Instructions (Signed)
Please call and follow up with GI specialist for further evaluation and management of your recurrent duodenitis.  Take medication prescribes.

## 2017-11-01 ENCOUNTER — Other Ambulatory Visit: Payer: Self-pay | Admitting: Physician Assistant

## 2017-11-01 DIAGNOSIS — N6310 Unspecified lump in the right breast, unspecified quadrant: Secondary | ICD-10-CM

## 2017-11-08 ENCOUNTER — Other Ambulatory Visit: Payer: Self-pay | Admitting: Radiology

## 2017-11-22 ENCOUNTER — Other Ambulatory Visit: Payer: Self-pay | Admitting: Surgery

## 2018-02-11 ENCOUNTER — Emergency Department (HOSPITAL_COMMUNITY)
Admission: EM | Admit: 2018-02-11 | Discharge: 2018-02-11 | Disposition: A | Discharge status: Home / Self Care | Payer: BLUE CROSS/BLUE SHIELD | Attending: Emergency Medicine | Admitting: Emergency Medicine

## 2018-02-11 ENCOUNTER — Other Ambulatory Visit: Payer: Self-pay

## 2018-02-11 ENCOUNTER — Encounter (HOSPITAL_COMMUNITY): Payer: Self-pay | Admitting: *Deleted

## 2018-02-11 ENCOUNTER — Emergency Department (HOSPITAL_COMMUNITY): Payer: BLUE CROSS/BLUE SHIELD

## 2018-02-11 DIAGNOSIS — Z79899 Other long term (current) drug therapy: Secondary | ICD-10-CM | POA: Diagnosis not present

## 2018-02-11 DIAGNOSIS — R1084 Generalized abdominal pain: Secondary | ICD-10-CM

## 2018-02-11 DIAGNOSIS — R1013 Epigastric pain: Secondary | ICD-10-CM | POA: Insufficient documentation

## 2018-02-11 DIAGNOSIS — R252 Cramp and spasm: Secondary | ICD-10-CM | POA: Diagnosis not present

## 2018-02-11 DIAGNOSIS — R1032 Left lower quadrant pain: Secondary | ICD-10-CM | POA: Diagnosis not present

## 2018-02-11 LAB — URINALYSIS, ROUTINE W REFLEX MICROSCOPIC
BILIRUBIN URINE: NEGATIVE
Bacteria, UA: NONE SEEN
GLUCOSE, UA: NEGATIVE mg/dL
KETONES UR: NEGATIVE mg/dL
LEUKOCYTES UA: NEGATIVE
NITRITE: NEGATIVE
PH: 6 (ref 5.0–8.0)
PROTEIN: NEGATIVE mg/dL
Specific Gravity, Urine: 1.003 — ABNORMAL LOW (ref 1.005–1.030)

## 2018-02-11 LAB — COMPREHENSIVE METABOLIC PANEL
ALBUMIN: 3.5 g/dL (ref 3.5–5.0)
ALK PHOS: 85 U/L (ref 38–126)
ALT: 21 U/L (ref 0–44)
ANION GAP: 8 (ref 5–15)
AST: 15 U/L (ref 15–41)
BILIRUBIN TOTAL: 0.4 mg/dL (ref 0.3–1.2)
BUN: 7 mg/dL (ref 6–20)
CALCIUM: 9.3 mg/dL (ref 8.9–10.3)
CO2: 28 mmol/L (ref 22–32)
Chloride: 104 mmol/L (ref 98–111)
Creatinine, Ser: 0.85 mg/dL (ref 0.44–1.00)
GFR calc Af Amer: >60 mL/min (ref >60)
GLUCOSE: 95 mg/dL (ref 70–99)
POTASSIUM: 4.2 mmol/L (ref 3.5–5.1)
Sodium: 140 mmol/L (ref 135–145)
TOTAL PROTEIN: 7.1 g/dL (ref 6.5–8.1)

## 2018-02-11 LAB — CBC
HEMATOCRIT: 39.6 % (ref 36.0–46.0)
HEMOGLOBIN: 13.2 g/dL (ref 12.0–15.0)
MCH: 29.1 pg (ref 26.0–34.0)
MCHC: 33.3 g/dL (ref 30.0–36.0)
MCV: 87.2 fL (ref 78.0–100.0)
Platelets: 331 10*3/uL (ref 150–400)
RBC: 4.54 MIL/uL (ref 3.87–5.11)
RDW: 13.5 % (ref 11.5–15.5)
WBC: 13.2 10*3/uL — AB (ref 4.0–10.5)

## 2018-02-11 LAB — LIPASE, BLOOD: Lipase: 38 U/L (ref 11–51)

## 2018-02-11 LAB — I-STAT BETA HCG BLOOD, ED (MC, WL, AP ONLY): I-stat hCG, quantitative: <5 m[IU]/mL (ref <5)

## 2018-02-11 LAB — I-STAT TROPONIN, ED: Troponin i, poc: 0 ng/mL (ref 0.00–0.08)

## 2018-02-11 MED ORDER — IOHEXOL 300 MG/ML  SOLN
100.0000 mL | Freq: Once | INTRAMUSCULAR | Status: AC | PRN
  Administered 2018-02-11: 100 mL via INTRAVENOUS

## 2018-02-11 MED ORDER — SODIUM CHLORIDE 0.9 % IV BOLUS
1000.0000 mL | Freq: Once | INTRAVENOUS | Status: AC
  Administered 2018-02-11: 1000 mL via INTRAVENOUS

## 2018-02-11 MED ORDER — DICYCLOMINE HCL 20 MG PO TABS: 20.0000 mg | ORAL_TABLET | Freq: Two times a day (BID) | ORAL | 0 refills | Status: AC | PRN

## 2018-02-11 MED ORDER — FAMOTIDINE IN NACL 20-0.9 MG/50ML-% IV SOLN
20.0000 mg | Freq: Once | INTRAVENOUS | Status: AC
  Administered 2018-02-11: 20 mg via INTRAVENOUS
  Filled 2018-02-11: qty 50

## 2018-02-11 MED ORDER — FAMOTIDINE 20 MG PO TABS: 20.0000 mg | ORAL_TABLET | Freq: Two times a day (BID) | ORAL | 0 refills | Status: AC | PRN

## 2018-02-11 MED ORDER — SODIUM CHLORIDE 0.9 % IV SOLN
INTRAVENOUS | Status: DC | PRN
  Administered 2018-02-11: 500 mL via INTRAVENOUS

## 2018-02-11 MED ORDER — ONDANSETRON HCL 4 MG/2ML IJ SOLN
4.0000 mg | Freq: Once | INTRAMUSCULAR | Status: AC
  Administered 2018-02-11: 4 mg via INTRAVENOUS
  Filled 2018-02-11: qty 2

## 2018-02-11 MED ORDER — MORPHINE SULFATE (PF) 4 MG/ML IV SOLN
4.0000 mg | Freq: Once | INTRAVENOUS | Status: AC
  Administered 2018-02-11: 4 mg via INTRAVENOUS
  Filled 2018-02-11: qty 1

## 2018-02-11 NOTE — ED Provider Notes (Signed)
[facility] Provider Note   CSN: [id number] Arrival date & time: 02/11/18  1734     History   Chief Complaint Chief Complaint  Patient presents with  . Abdominal Pain    HPI Karina Mueller is a 57 y.o. female previous hysterectomy and partial oophorectomy, here presenting with abdominal pain.  Patient states that she has been having epigastric pain as well as lower abdominal pain for the last week or so.  Patient went to see primary care doctor was prescribed pain medicine and muscle relaxants but she did not take them.  She was also ordered outpatient CT abdomen pelvis but did not get them done yet.  She states that over the last week, her pain progressively got worse.  She states that especially after she eats she has worse abdominal pain.  She had some left lower quadrant pain earlier today but denies any constipation.  She denies any urinary symptoms.  The history is provided by the patient.    History reviewed. No pertinent past medical history.  There are no active problems to display for this patient.   Past Surgical History:  Procedure Laterality Date  . ABDOMINAL HYSTERECTOMY       OB History   None      Home Medications    Prior to Admission medications   Medication Sig Start Date End Date Taking? Authorizing Provider  omeprazole (PRILOSEC) 20 MG capsule Take 1 capsule (20 mg total) by mouth daily. 11/23/16   Fayrene Helperran, [place], PA-C  ranitidine (ZANTAC) 150 MG capsule Take 1 capsule (150 mg total) by mouth daily. 11/23/16   Fayrene Helperran, [place], PA-C  sucralfate (CARAFATE) 1 g tablet Take 1 tablet (1 g total) by mouth 4 (four) times daily -  with meals and at bedtime. 11/23/16   Fayrene Helperran, [place], PA-C    Family History No family history on file.  Social History Social History   Tobacco Use  . Smoking status: Never Smoker  . Smokeless tobacco: Never Used  Substance Use Topics  . Alcohol use: No  . Drug use: No     Allergies     Patient has no known allergies.   Review of Systems Review of Systems  Gastrointestinal: Positive for abdominal pain.  All other systems reviewed and are negative.    Physical Exam Updated Vital Signs BP 115/72 (BP Location: Right Arm)   Pulse 86   Temp 99.7 F (37.6 C) (Oral)   Resp 20   Ht 5\' 4"  (1.626 m)   Wt 77.1 kg   SpO2 97%   BMI 29.18 kg/m   Physical Exam  Constitutional: She is oriented to person, place, and time.  Uncomfortable   HENT:  Head: Normocephalic.  MM slightly dry   Eyes: Pupils are equal, round, and reactive to light. EOM are normal.  Cardiovascular: Normal rate, regular rhythm and normal heart sounds.  Pulmonary/Chest: Effort normal and breath sounds normal.  Abdominal:  + LLQ and epigastric tenderness and L CVAT   Neurological: She is alert and oriented to person, place, and time.  Skin: Skin is warm and dry. Capillary refill takes less than 2 seconds.  Psychiatric: She has a normal mood and affect.  Nursing note and vitals reviewed.    ED Treatments / Results  Labs (all labs ordered are listed, but only abnormal results are displayed) Labs Reviewed  CBC - Abnormal; Notable for the following components:      Result Value   WBC 13.2 (*)  All other components within normal limits  URINALYSIS, ROUTINE W REFLEX MICROSCOPIC - Abnormal; Notable for the following components:   Color, Urine STRAW (*)    Specific Gravity, Urine 1.003 (*)    Hgb urine dipstick SMALL (*)    All other components within normal limits  LIPASE, BLOOD  COMPREHENSIVE METABOLIC PANEL  I-STAT BETA HCG BLOOD, ED ([facility], [facility], [facility] ONLY)  I-STAT TROPONIN, ED    EKG None  Radiology No results found.  Procedures Procedures (including critical care time)  Medications Ordered in ED Medications  morphine 4 MG/ML injection 4 mg (has no administration in time range)  famotidine (PEPCID) IVPB 20 mg premix (has no administration in time range)  sodium chloride 0.9 %  bolus 1,000 mL (has no administration in time range)  ondansetron (ZOFRAN) injection 4 mg (has no administration in time range)     Initial Impression / Assessment and Plan / ED Course  I have reviewed the triage vital signs and the nursing notes.  Pertinent labs & imaging results that were available during my care of the patient were reviewed by me and considered in my medical decision making (see chart for details).     Karina Mueller is a 57 y.o. female here with abdominal pain. Epigastric pain also LLQ pain. Consider pancreatitis vs gastritis vs diverticulitis. Will get labs, UA, CT ab/pel.   9:06 PM WBC 13. UA nl. CT unremarkable. Pain improved in the ED. She seems to have recurrent cramps and abdominal pain with normal CT a year ago. Will start on bentyl for cramps, pepcid prn. Will refer to GI for further evaluation.    Final Clinical Impressions(s) / ED Diagnoses   Final diagnoses:  None    ED Discharge Orders    None       Charlynne Pander, MD 02/11/18 2108

## 2018-02-11 NOTE — Discharge Instructions (Signed)
Stay hydrated.   Take pepcid twice daily as needed for heart burn.   Take bentyl twice daily as needed for abdominal cramps   You should follow up with GI outpatient if you have persistent cramps or pain   Return to ER if you have worse abdominal pain, vomiting, fevers.

## 2018-02-11 NOTE — ED Triage Notes (Signed)
The pt is c/o abd pain since the 4th of this month  She was seen by her doctor on the 8th  Her pain is no better and may be a little  worse

## 2018-03-03 ENCOUNTER — Other Ambulatory Visit: Payer: Self-pay | Admitting: Physician Assistant

## 2018-03-03 DIAGNOSIS — R102 Pelvic and perineal pain: Secondary | ICD-10-CM

## 2018-03-09 ENCOUNTER — Ambulatory Visit
Admission: RE | Admit: 2018-03-09 | Discharge: 2018-03-09 | Disposition: A | Discharge status: Home / Self Care | Payer: BLUE CROSS/BLUE SHIELD | Source: Ambulatory Visit | Attending: Physician Assistant | Admitting: Physician Assistant

## 2018-03-09 DIAGNOSIS — R102 Pelvic and perineal pain: Secondary | ICD-10-CM

## 2019-02-21 IMAGING — CT CT ABD-PELV W/ CM
2 of 5 series · 17 of 46 positions shown, 19 images · IV contrast (APPLIED)
Comparison: 11/23/2016

CLINICAL DATA: Nausea and vomiting.

EXAM:
CT ABDOMEN AND PELVIS WITH CONTRAST
TECHNIQUE: Multidetector CT imaging of the abdomen and pelvis was performed
using the standard protocol following bolus administration of
intravenous contrast.
CONTRAST:  100mL OMNIPAQUE IOHEXOL 300 MG/ML  SOLN

[Series 3: abd/ pelvis 5.0 i30f 2 · axial · 0.89mm/px · z∈[+793,+1213]mm · 14 of 94 slices shown, 16 images]
[im 5/94  soft-tissue]
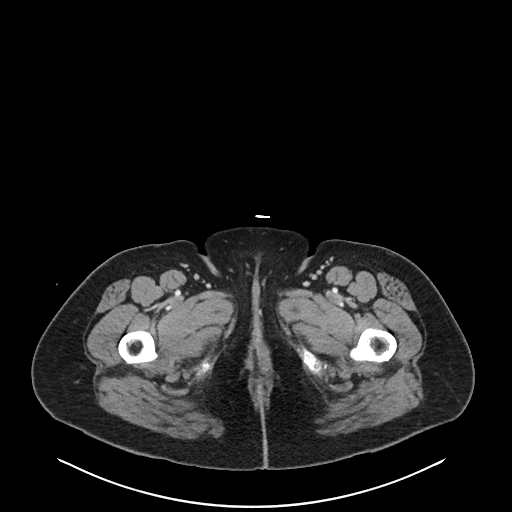
[im 5/94  bone]
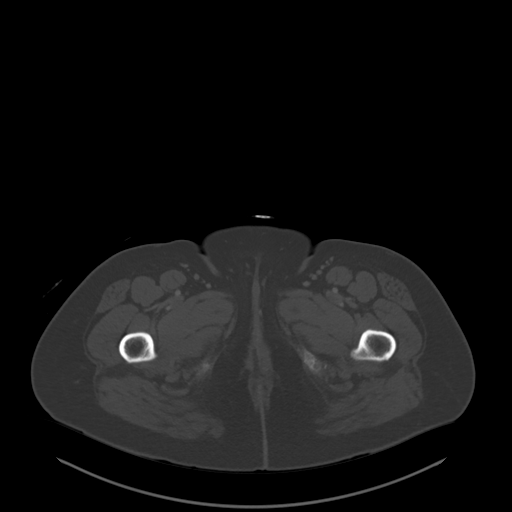
[im 14/94  soft-tissue]
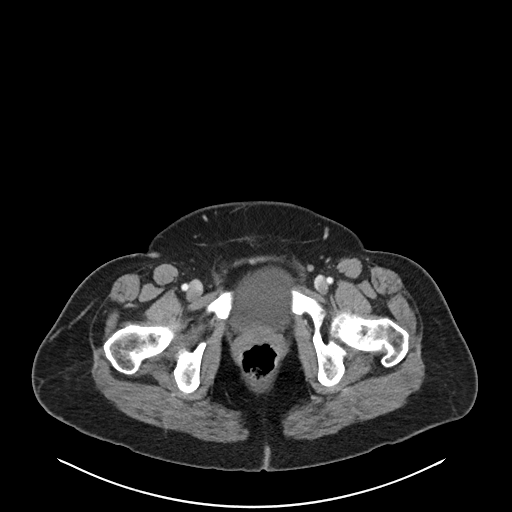
[im 19/94  soft-tissue]
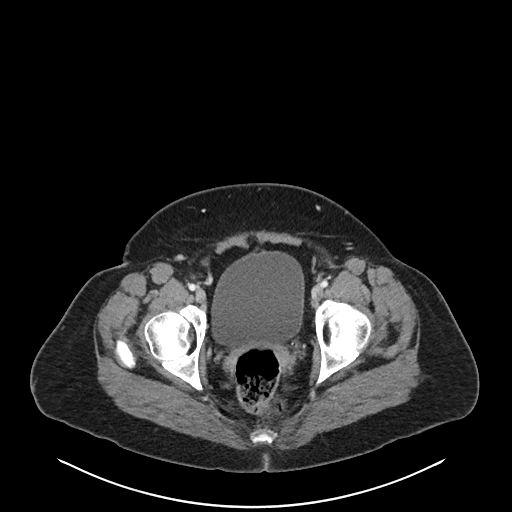
[im 24/94  soft-tissue]
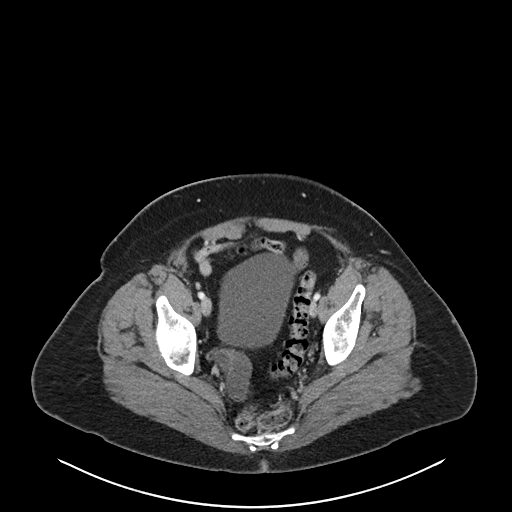
[im 33/94  soft-tissue]
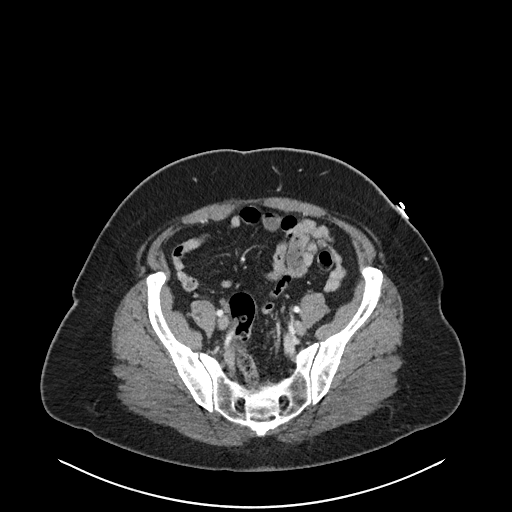
[im 38/94  soft-tissue]
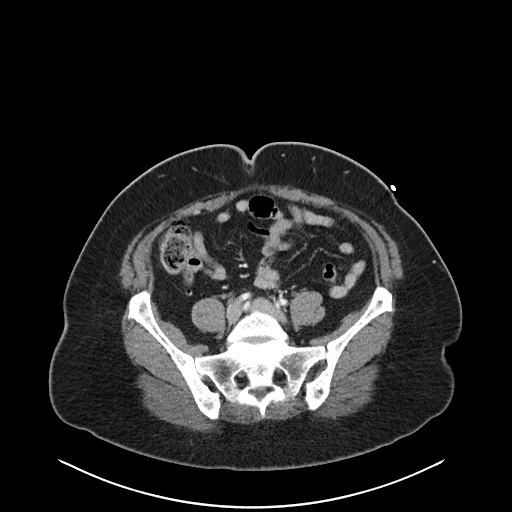
[im 42/94  soft-tissue]
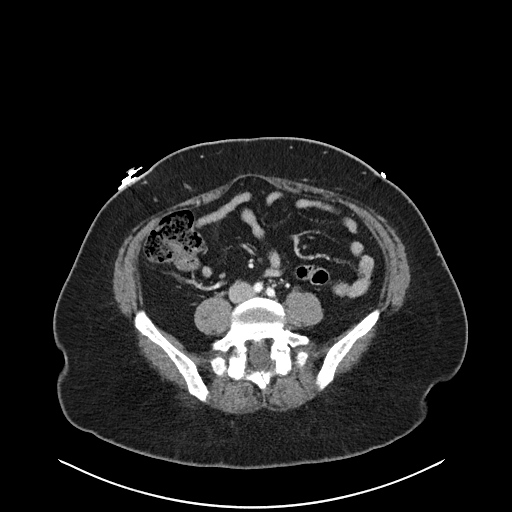
[im 52/94  soft-tissue]
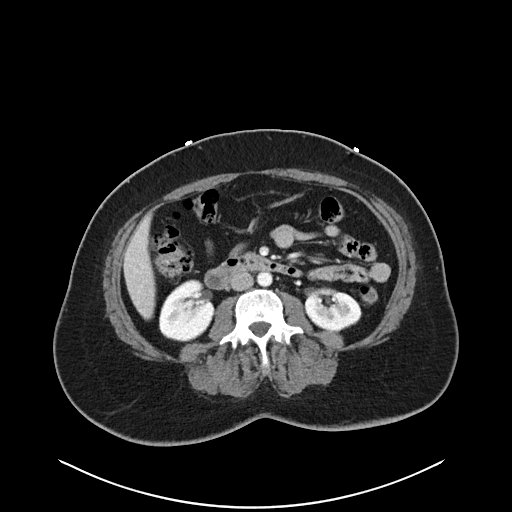
[im 56/94  soft-tissue]
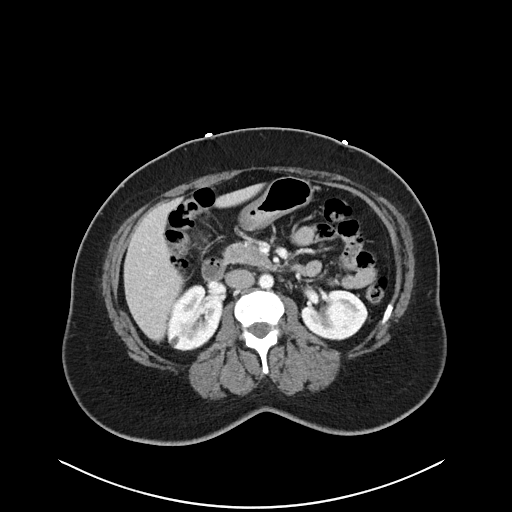
[im 56/94  bone]
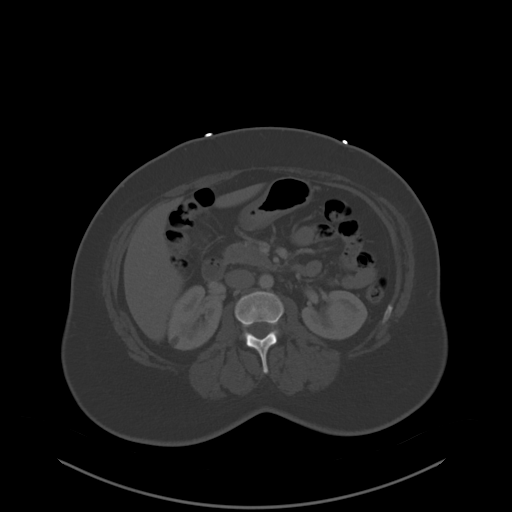
[im 61/94  soft-tissue]
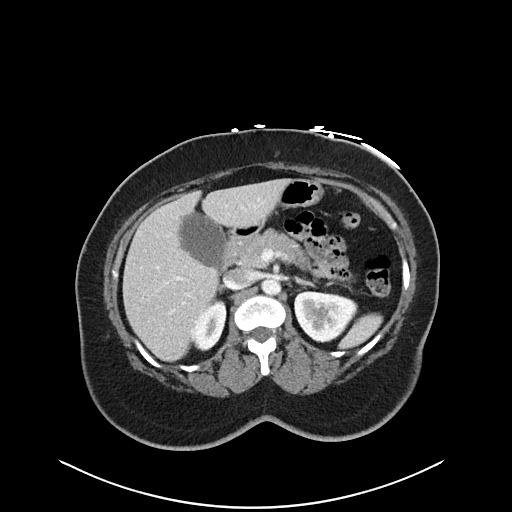
[im 70/94  soft-tissue]
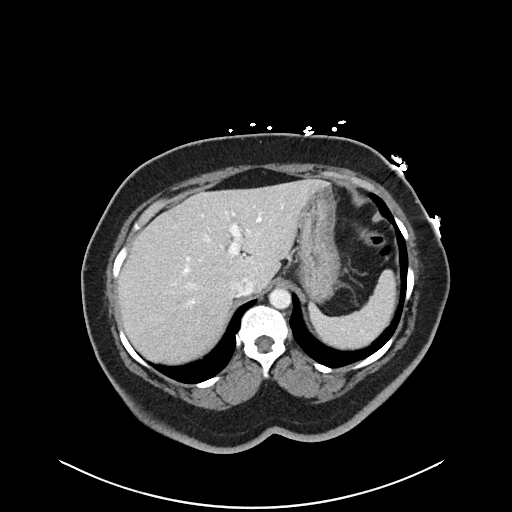
[im 75/94  soft-tissue]
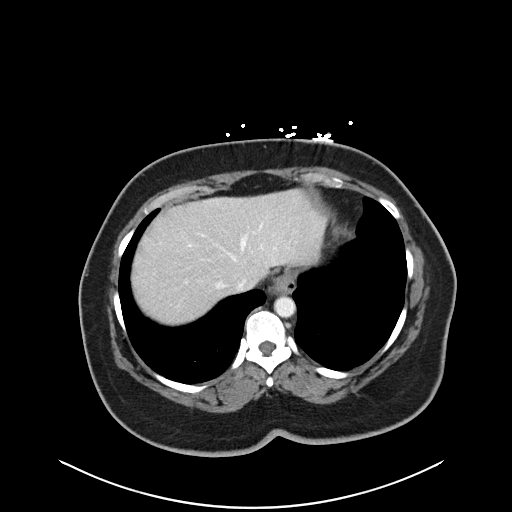
[im 80/94  soft-tissue]
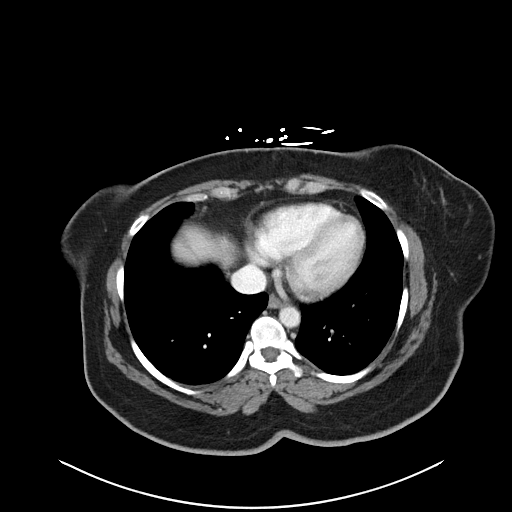
[im 89/94  soft-tissue]
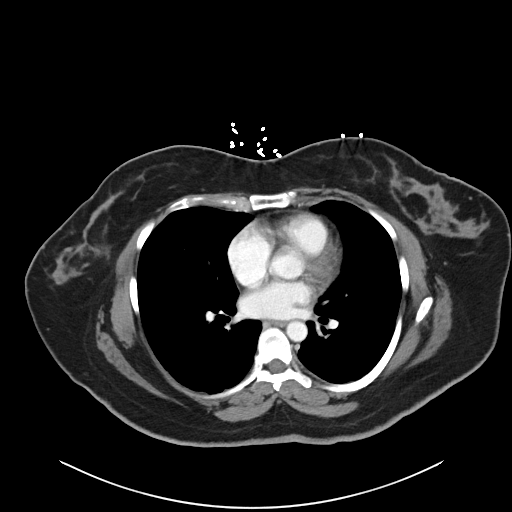

[Series 6: coronal soft tissue · coronal · 0.84mm/px · 3 of 101 slices shown]
[im 34/101  soft-tissue]
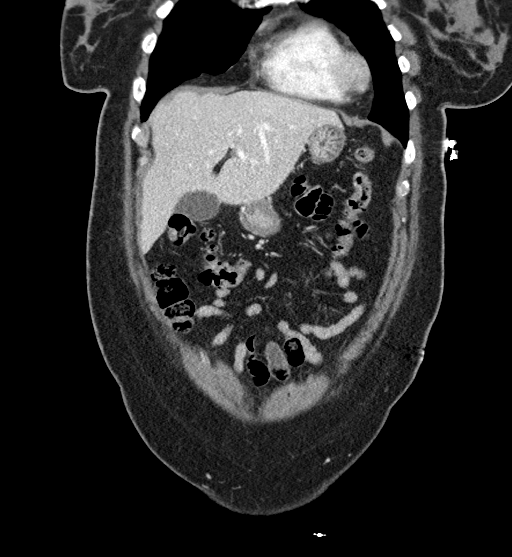
[im 45/101  soft-tissue]
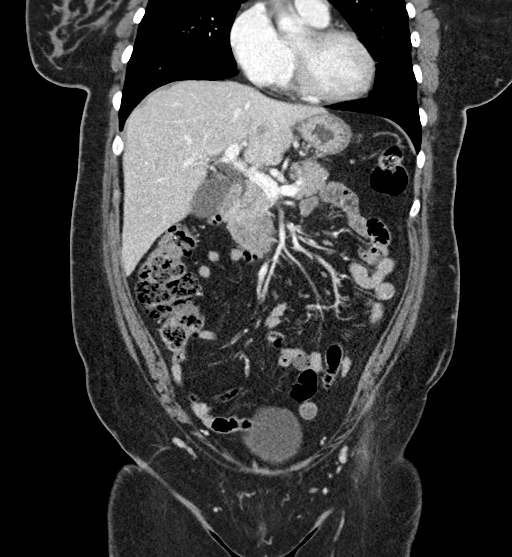
[im 56/101  soft-tissue]
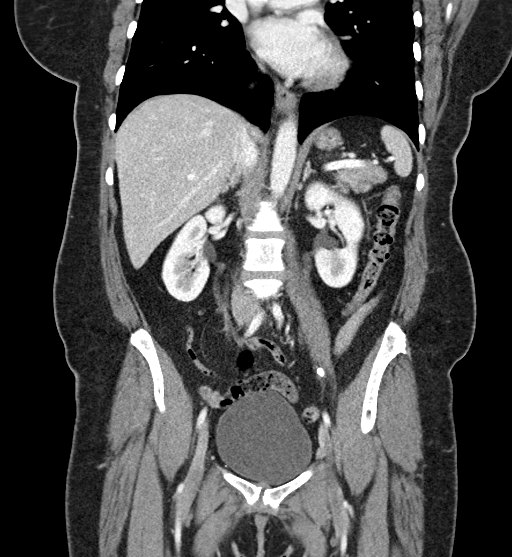

[17 of 46 positions shown; findings below may reference images not displayed]

FINDINGS: Lower chest: No acute abnormality.

Hepatobiliary: No focal liver abnormality is seen. No gallstones,
gallbladder wall thickening, or biliary dilatation.

Pancreas: Unremarkable. No pancreatic ductal dilatation or
surrounding inflammatory changes.

Spleen: Normal in size without focal abnormality.

Adrenals/Urinary Tract: Adrenal glands are unremarkable. Kidneys are
normal, without renal calculi, focal lesion, or hydronephrosis.
Bladder is unremarkable.

Stomach/Bowel: Stomach is within normal limits. Appendix appears
normal. No evidence of bowel wall thickening, distention, or
inflammatory changes.

Vascular/Lymphatic: Normal appearance of the abdominal aorta. No
enlarged retroperitoneal or mesenteric adenopathy. No enlarged
pelvic or inguinal lymph nodes.

Reproductive: Status post hysterectomy. Fluid density tubular
structure within the right pelvis is identified and appears similar
to previous exam, favor chronic hydrosalpinx, image 68/3.

Other: No abdominopelvic ascites. No focal fluid collections.

Musculoskeletal: No acute abnormality. Bilateral L5 pars defects.
Mild anterolisthesis of L5 on S1 noted.
IMPRESSION: No acute findings within the abdomen or pelvis. No explanation for
patient's abdominal pain.

## 2019-09-02 ENCOUNTER — Ambulatory Visit: Payer: Self-pay | Admitting: Registered Nurse

## 2019-09-11 ENCOUNTER — Ambulatory Visit: Payer: Self-pay | Admitting: Registered Nurse

## 2019-09-12 ENCOUNTER — Encounter: Payer: Self-pay | Admitting: Registered Nurse

## 2019-10-12 ENCOUNTER — Encounter (HOSPITAL_COMMUNITY): Payer: Self-pay | Admitting: Emergency Medicine

## 2019-10-12 ENCOUNTER — Other Ambulatory Visit: Payer: Self-pay

## 2019-10-12 ENCOUNTER — Emergency Department (HOSPITAL_COMMUNITY): Payer: HRSA Program

## 2019-10-12 ENCOUNTER — Emergency Department (HOSPITAL_COMMUNITY)
Admission: EM | Admit: 2019-10-12 | Discharge: 2019-10-12 | Disposition: A | Discharge status: Home / Self Care | Payer: HRSA Program | Attending: Emergency Medicine | Admitting: Emergency Medicine

## 2019-10-12 DIAGNOSIS — R509 Fever, unspecified: Secondary | ICD-10-CM | POA: Diagnosis not present

## 2019-10-12 DIAGNOSIS — R0789 Other chest pain: Secondary | ICD-10-CM | POA: Insufficient documentation

## 2019-10-12 DIAGNOSIS — R0602 Shortness of breath: Secondary | ICD-10-CM | POA: Insufficient documentation

## 2019-10-12 DIAGNOSIS — R05 Cough: Secondary | ICD-10-CM | POA: Diagnosis not present

## 2019-10-12 DIAGNOSIS — U071 COVID-19: Secondary | ICD-10-CM | POA: Insufficient documentation

## 2019-10-12 HISTORY — DX: COVID-19: U07.1

## 2019-10-12 LAB — CBC
HCT: 39.3 % (ref 36.0–46.0)
Hemoglobin: 13.3 g/dL (ref 12.0–15.0)
MCH: 29.6 pg (ref 26.0–34.0)
MCHC: 33.8 g/dL (ref 30.0–36.0)
MCV: 87.3 fL (ref 80.0–100.0)
Platelets: 210 10*3/uL (ref 150–400)
RBC: 4.5 MIL/uL (ref 3.87–5.11)
RDW: 14 % (ref 11.5–15.5)
WBC: 8.3 10*3/uL (ref 4.0–10.5)
nRBC: 0 % (ref 0.0–0.2)

## 2019-10-12 LAB — I-STAT CHEM 8, ED
BUN: 18 mg/dL (ref 6–20)
Calcium, Ion: 1.15 mmol/L (ref 1.15–1.40)
Chloride: 103 mmol/L (ref 98–111)
Creatinine, Ser: 0.8 mg/dL (ref 0.44–1.00)
Glucose, Bld: 111 mg/dL — ABNORMAL HIGH (ref 70–99)
HCT: 44 % (ref 36.0–46.0)
Hemoglobin: 15 g/dL (ref 12.0–15.0)
Potassium: 3.8 mmol/L (ref 3.5–5.1)
Sodium: 142 mmol/L (ref 135–145)
TCO2: 27 mmol/L (ref 22–32)

## 2019-10-12 MED ORDER — BENZONATATE 100 MG PO CAPS: 100.0000 mg | ORAL_CAPSULE | Freq: Three times a day (TID) | ORAL | 0 refills | Status: AC

## 2019-10-12 MED ORDER — ACETAMINOPHEN 500 MG PO TABS: 500.0000 mg | ORAL_TABLET | Freq: Four times a day (QID) | ORAL | 0 refills | Status: AC | PRN

## 2019-10-12 NOTE — ED Provider Notes (Signed)
[facility] EMERGENCY DEPARTMENT Provider Note   CSN: [id number] Arrival date & time: 10/12/19  1106     History Chief Complaint  Patient presents with  . COVID +  . Cough  . Fever    Karina Mueller is a 59 y.o. female.  The history is provided by the patient and medical records. No language interpreter was used.  Cough Associated symptoms: fever   Fever Associated symptoms: cough      59 year old Hispanic female recently diagnosed with COVID-19 five days ago presenting complaining of cough and chest discomfort.  Patient states she developed symptoms approximately a week ago.  Symptoms including nonproductive cough, fever as high as 103, mild shortness of breath, and decreased energy level.  She was tested 5 days ago and test positive for COVID-19.  She was given prednisone, Z-Pak, and cough medication which does provide some relief.  However her symptom has not fully resolved.  She still endorse some discomfort in her chest when she breathes along with coughing.  She endorsed mild shortness of breath.  She denies any hemoptysis loss of taste or smell nausea vomiting diarrhea lightheadedness or dizziness.  She is not a smoker or drinker.  No significant cardiac history.  Past Medical History:  Diagnosis Date  . COVID-19     There are no problems to display for this patient.   Past Surgical History:  Procedure Laterality Date  . ABDOMINAL HYSTERECTOMY       OB History   No obstetric history on file.     No family history on file.  Social History   Tobacco Use  . Smoking status: Never Smoker  . Smokeless tobacco: Never Used  Substance Use Topics  . Alcohol use: No  . Drug use: No    Home Medications Prior to Admission medications   Medication Sig Start Date End Date Taking? Authorizing Provider  dicyclomine (BENTYL) 20 MG tablet Take 1 tablet (20 mg total) by mouth 2 (two) times daily as needed for spasms. 02/11/18   Drenda Freeze, MD    famotidine (PEPCID) 20 MG tablet Take 1 tablet (20 mg total) by mouth 2 (two) times daily as needed for heartburn or indigestion. 02/11/18   Drenda Freeze, MD  omeprazole (PRILOSEC) 20 MG capsule Take 1 capsule (20 mg total) by mouth daily. 11/23/16   Domenic Moras, PA-C  ranitidine (ZANTAC) 150 MG capsule Take 1 capsule (150 mg total) by mouth daily. 11/23/16   Domenic Moras, PA-C  sucralfate (CARAFATE) 1 g tablet Take 1 tablet (1 g total) by mouth 4 (four) times daily -  with meals and at bedtime. 11/23/16   Domenic Moras, PA-C    Allergies    Patient has no known allergies.  Review of Systems   Review of Systems  Constitutional: Positive for fever.  Respiratory: Positive for cough.   All other systems reviewed and are negative.   Physical Exam Updated Vital Signs BP 99/64 (BP Location: Right Arm)   Pulse 80   Temp 98.2 F (36.8 C) (Oral)   Resp 12   Ht 5\' 5"  (1.651 m)   Wt 73.9 kg   SpO2 98%   BMI 27.12 kg/m   Physical Exam Vitals and nursing note reviewed.  Constitutional:      General: She is not in acute distress.    Appearance: She is well-developed.  HENT:     Head: Atraumatic.  Eyes:     Conjunctiva/sclera: Conjunctivae normal.  Cardiovascular:  Rate and Rhythm: Normal rate and regular rhythm.     Pulses: Normal pulses.     Heart sounds: Normal heart sounds.  Pulmonary:     Breath sounds: Normal breath sounds. No wheezing or rhonchi.  Abdominal:     Palpations: Abdomen is soft.     Tenderness: There is no abdominal tenderness.  Musculoskeletal:     Cervical back: Neck supple.  Skin:    Findings: No rash.  Neurological:     Mental Status: She is alert and oriented to person, place, and time.  Psychiatric:        Mood and Affect: Mood normal.     ED Results / Procedures / Treatments   Labs (all labs ordered are listed, but only abnormal results are displayed) Labs Reviewed  I-STAT CHEM 8, ED - Abnormal; Notable for the following components:       Result Value   Glucose, Bld 111 (*)    All other components within normal limits  CBC    EKG None  Radiology DG Chest Portable 1 View  Result Date: 10/12/2019 CLINICAL DATA:  Shortness of breath, COVID-19 positive EXAM: PORTABLE CHEST 1 VIEW COMPARISON:  09/12/2015 FINDINGS: The heart size and mediastinal contours are within normal limits. No focal airspace consolidation, pleural effusion, or pneumothorax. The visualized skeletal structures are unremarkable. IMPRESSION: No active disease. Electronically Signed   By: Duanne Guess D.O.   On: 10/12/2019 12:17    Procedures Procedures (including critical care time)  Medications Ordered in ED Medications - No data to display  ED Course  I have reviewed the triage vital signs and the nursing notes.  Pertinent labs & imaging results that were available during my care of the patient were reviewed by me and considered in my medical decision making (see chart for details).    MDM Rules/Calculators/A&P                      BP (!) 95/57   Pulse 74   Temp 98.2 F (36.8 C) (Oral)   Resp (!) 21   Ht 5\' 5"  (1.651 m)   Wt 73.9 kg   SpO2 100%   BMI 27.12 kg/m   Final Clinical Impression(s) / ED Diagnoses Final diagnoses:  COVID-19 virus infection    Rx / DC Orders ED Discharge Orders         Ordered    benzonatate (TESSALON) 100 MG capsule  Every 8 hours     10/12/19 1306    acetaminophen (TYLENOL) 500 MG tablet  Every 6 hours PRN     10/12/19 1306         12:14 PM Patient recently diagnosed with COVID-19 here with complaints of cough and not feeling well.  She is well-appearing, no hypoxia, afebrile, speaking in complete sentences with a normal exam.  Will perform screening test.  1:03 PM Labs are reassuring, chest x-ray unremarkable.  At this time patient is stable for discharge with symptomatic treatment and strict return precaution.  Karina Mueller was evaluated in Emergency Department on 10/12/2019 for the symptoms  described in the history of present illness. She was evaluated in the context of the global COVID-19 pandemic, which necessitated consideration that the patient might be at risk for infection with the SARS-CoV-2 virus that causes COVID-19. Institutional protocols and algorithms that pertain to the evaluation of patients at risk for COVID-19 are in a state of rapid change based on information released by regulatory bodies including the  CDC and federal and [employer]. These policies and algorithms were followed during the patient's care in the ED.    Fayrene Helper, PA-C 10/12/19 1308    Curatolo, Adam, DO 10/12/19 1540

## 2019-10-12 NOTE — ED Triage Notes (Signed)
Diagnosed with COVID on Tuesday.  Reports cough since last night with fever.  Denies SOB.  Generalized pain across chest with deep inspiration.

## 2019-10-12 NOTE — ED Notes (Signed)
Pt d/c home per MD order. Discharge summary reviewed with pt, pt verbalizes understanding. No s/s of acute distress noted at discharge.  

## 2019-10-12 NOTE — Discharge Instructions (Signed)
Fortunately your chest xray did not show evidence of covid-19 pneumonia.  Your blood work are normal.  Take tessalon as needed for cough, take tylenol as needed for fever, aches, pain.  Return if you have any concerns.

## 2019-12-03 IMAGING — US US PELVIS COMPLETE TRANSABD/TRANSVAG
1 series · 14 of 25 positions shown · non-contrast
Comparison: CT 02/11/2018, 12/18/2015

CLINICAL DATA: Persistent pelvic pain



[Series 1: us pelvis complete transabd/transvag · 0.22mm/px · 14 of 44 slices shown]
[im 1/44]
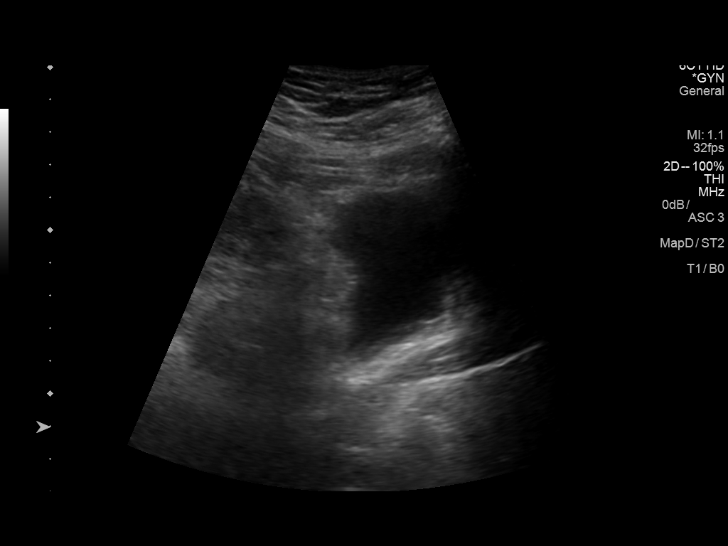
[im 4/44]
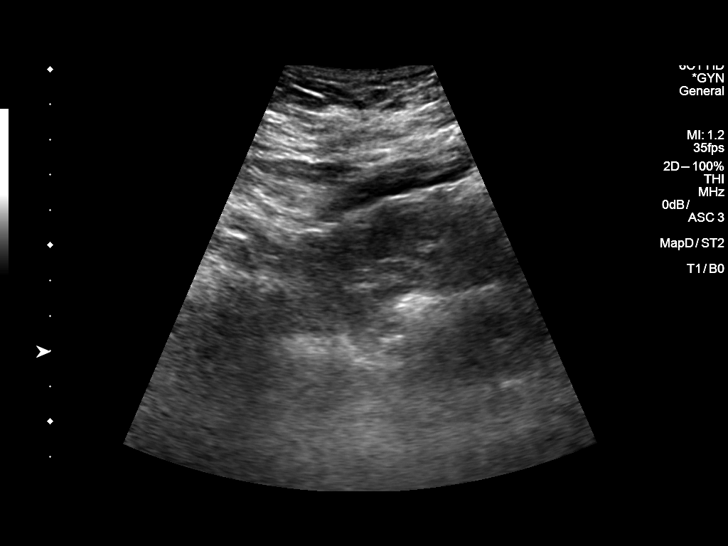
[im 8/44]
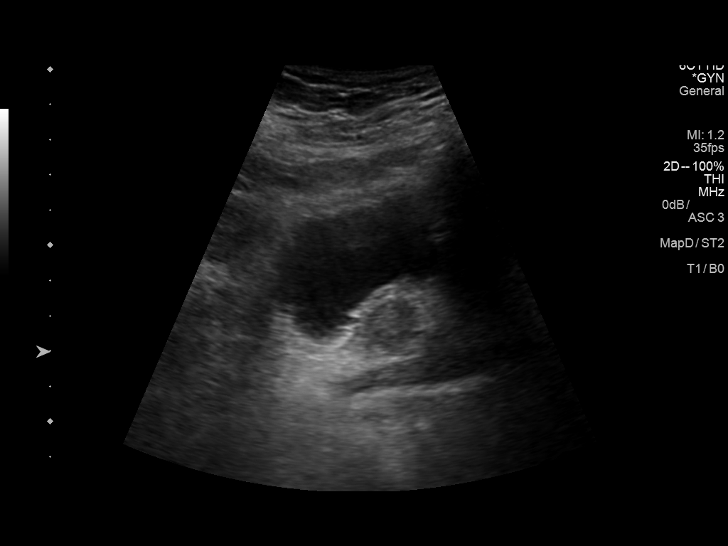
[im 11/44]
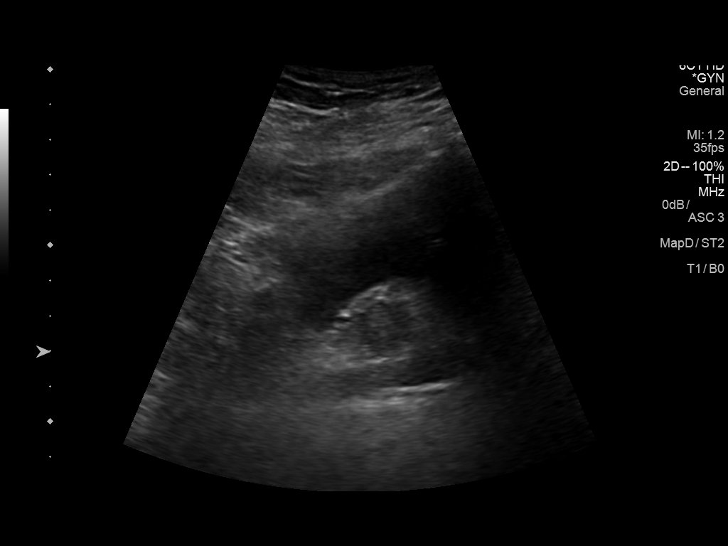
[im 15/44]
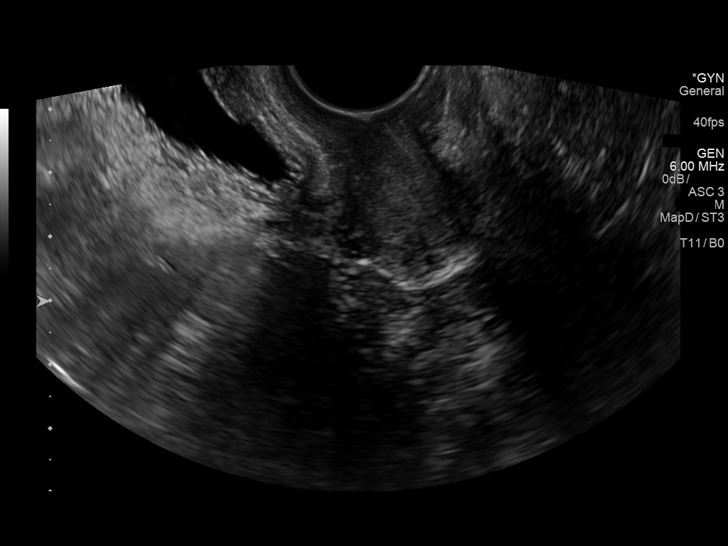
[im 17/44]
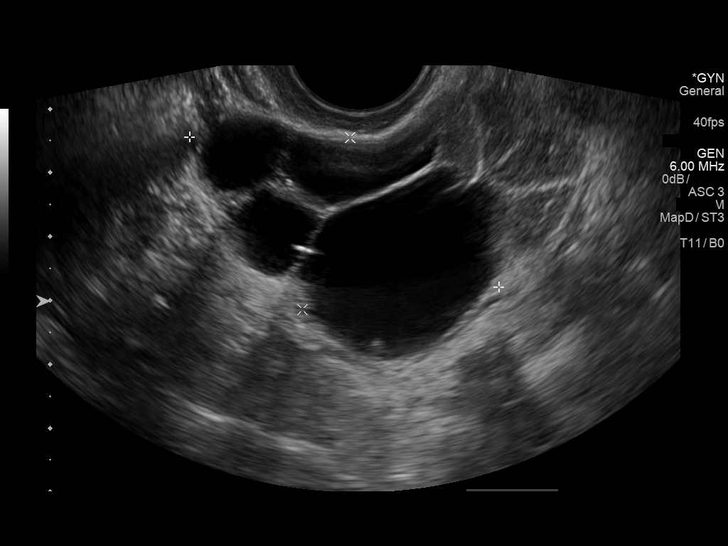
[im 20/44]
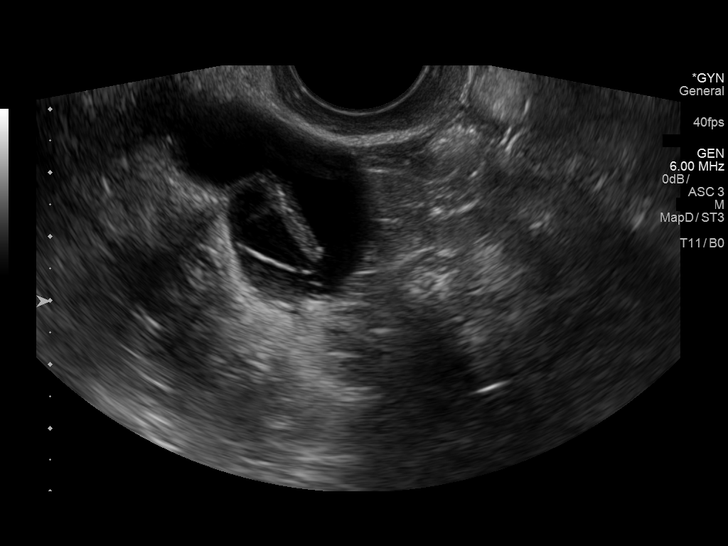
[im 24/44]
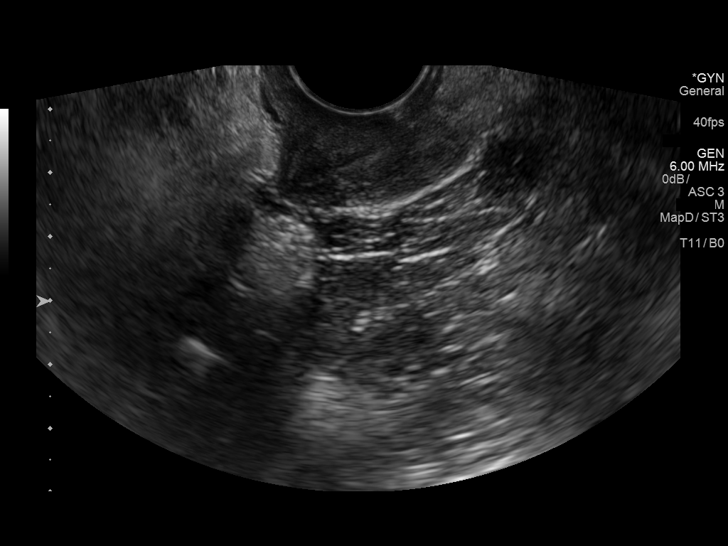
[im 27/44]
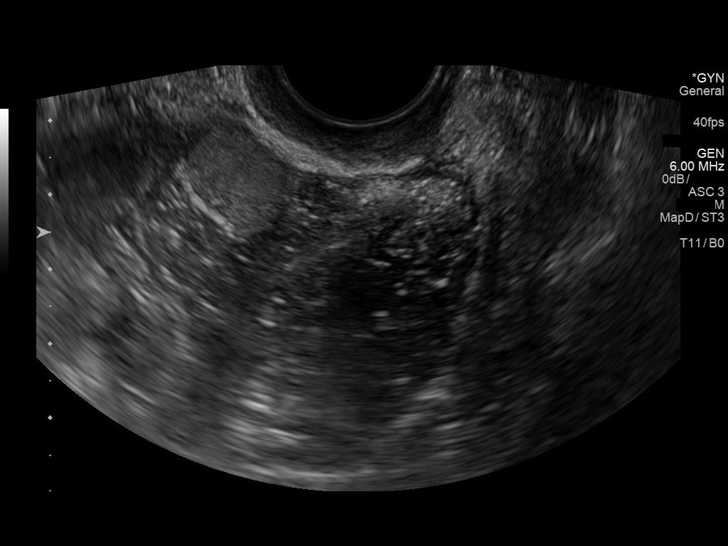
[im 29/44]
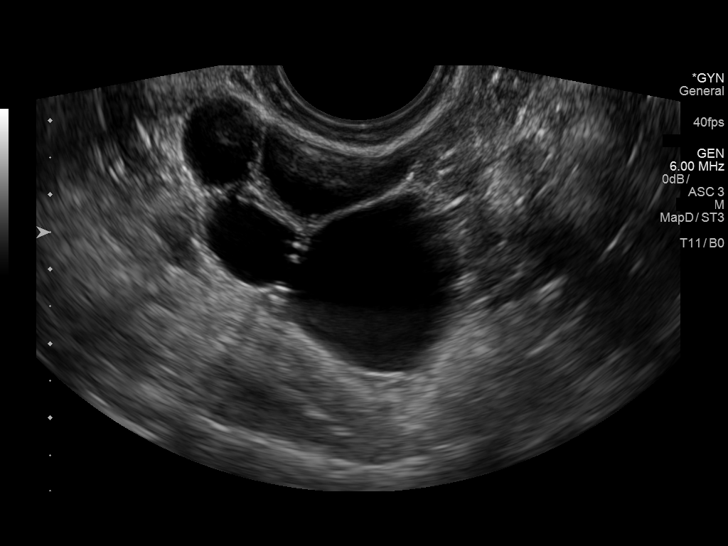
[im 33/44]
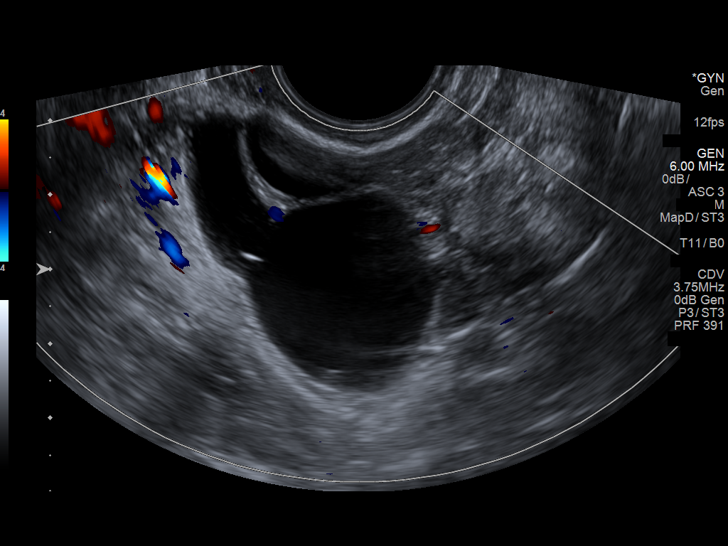
[im 36/44]
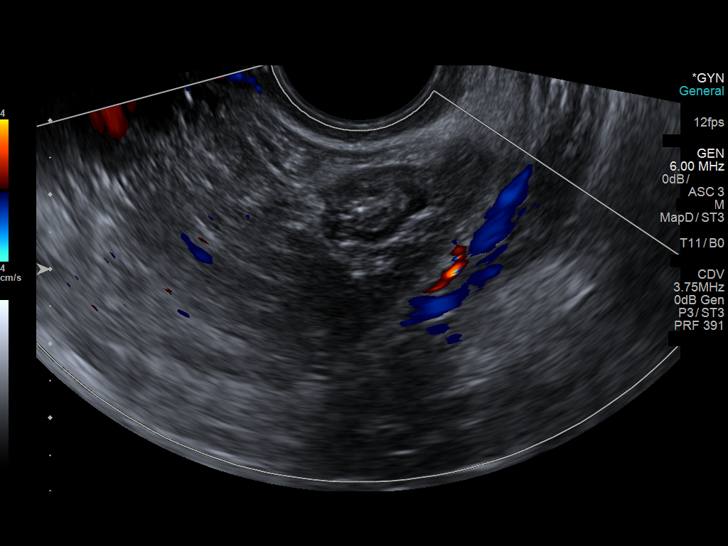
[im 40/44]
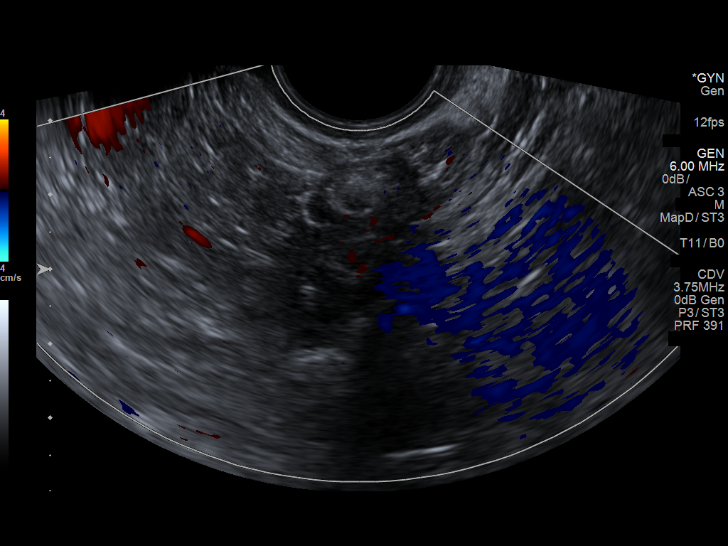
[im 44/44]
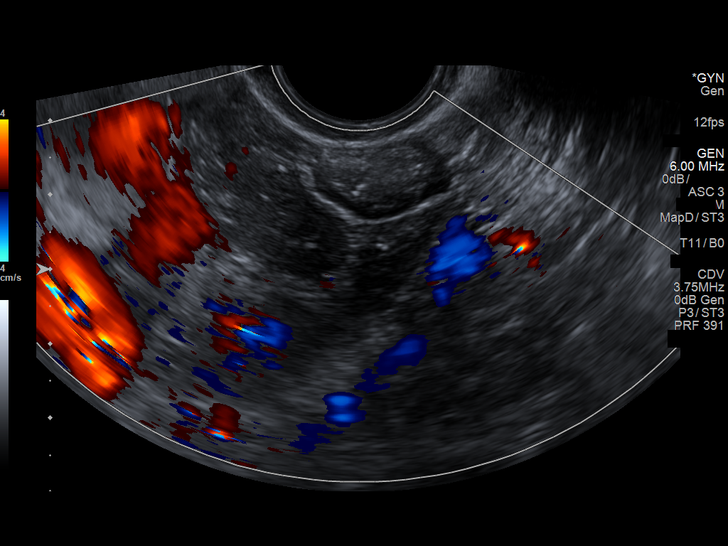

[14 of 25 positions shown; findings below may reference images not displayed]

FINDINGS: Uterus

Surgically absent

Endometrium

Surgically absent

Right ovary

Right adnexal slightly tubular and cystic mass measuring 5.4 x 2.8 x
3.5 cm.

Left ovary

Possibly visualize normal left ovary. Measurements: 1.7 x 1.2 x
cm. Normal appearance/no adnexal mass.

Other findings

No abnormal free fluid.
IMPRESSION: 1. Surgical absence of the uterus
2. Tubular and complex cystic right adnexal mass measuring 5.4 cm,
likely stable to CT 12/18/2015, favor benign process given lack of
interval change, possible chronic hydrosalpinx.

## 2020-10-21 IMAGING — DX DG CHEST 1V PORT
1 series · 1 of 1 positions shown · non-contrast
Comparison: 09/12/2015

CLINICAL DATA: Shortness of breath, [id number] positive

EXAM:
PORTABLE CHEST 1 VIEW

[chest]
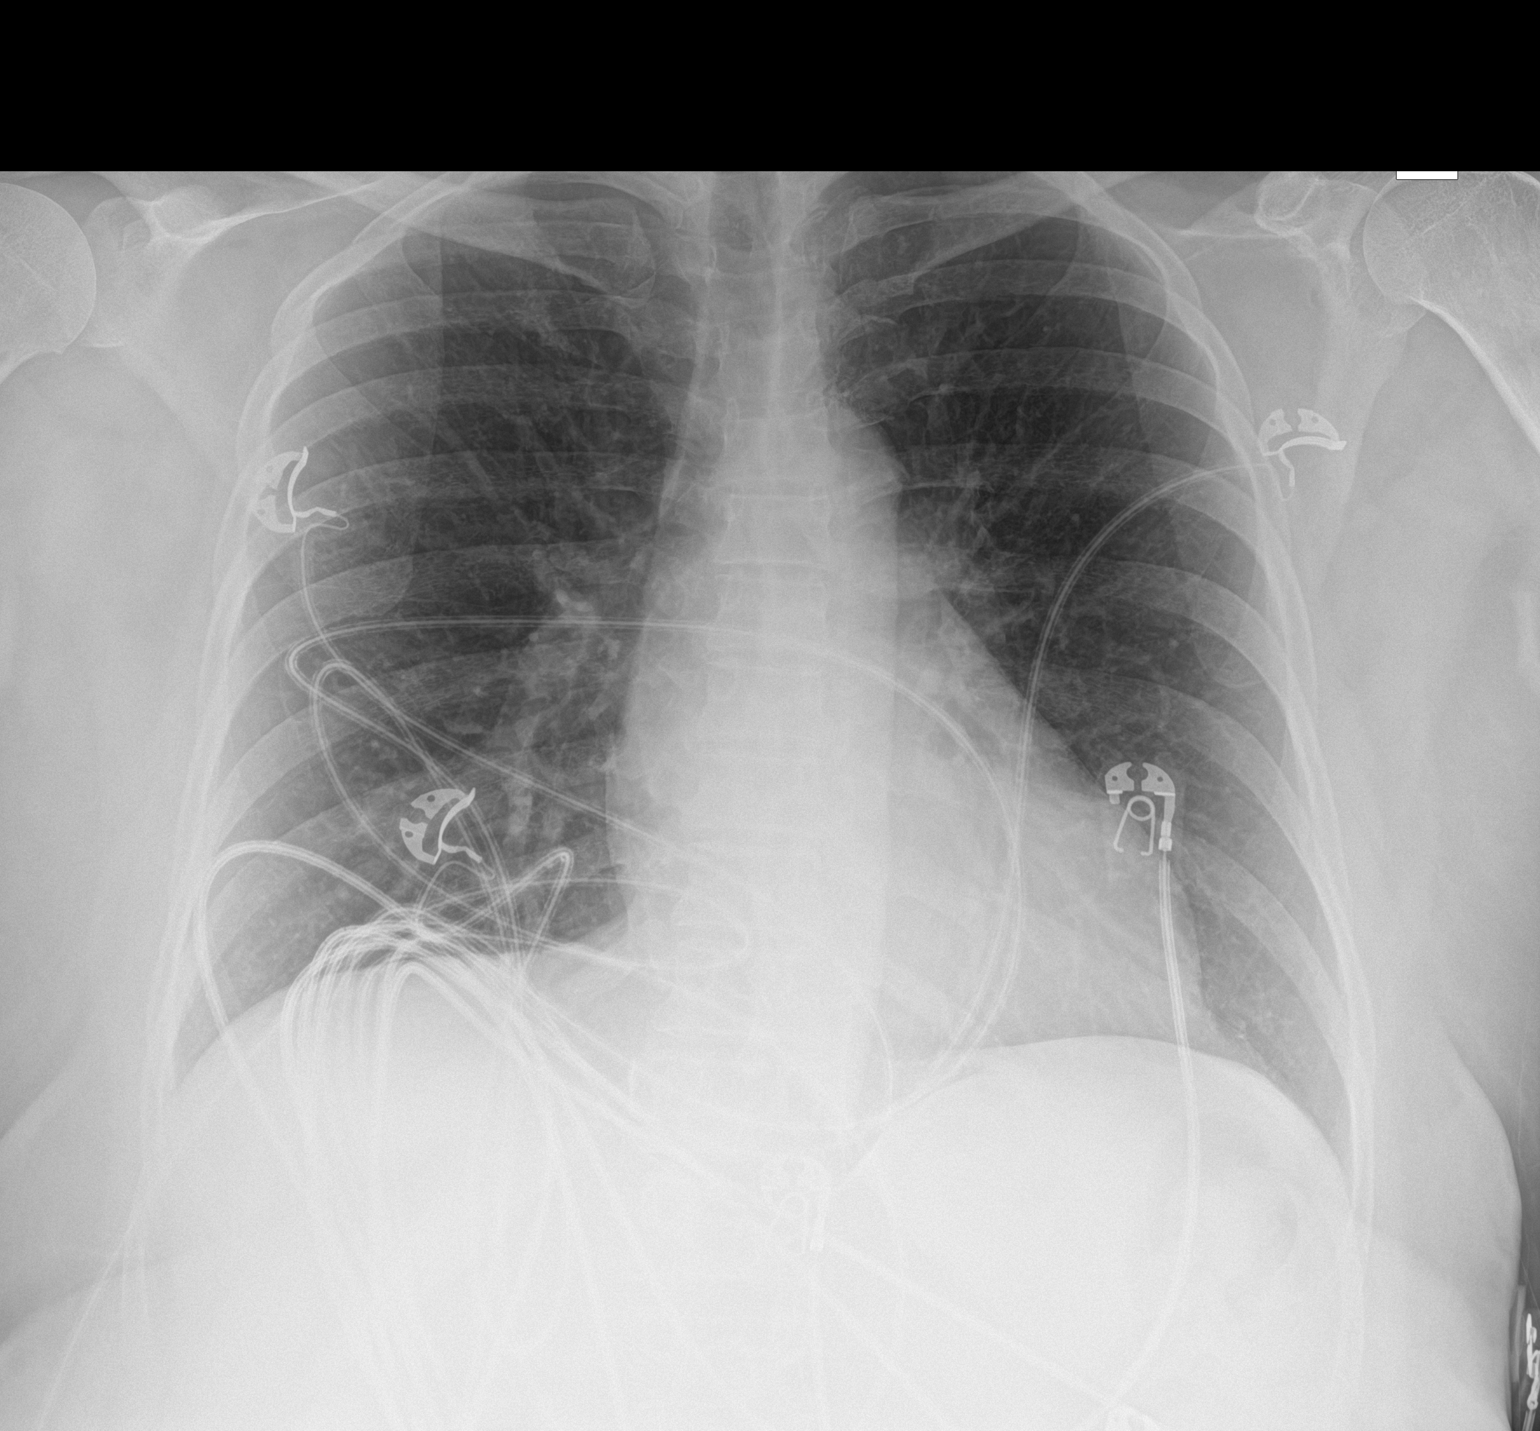

[1 of 1 positions shown; findings below may reference images not displayed]

FINDINGS: The heart size and mediastinal contours are within normal limits. No
focal airspace consolidation, pleural effusion, or pneumothorax. The
visualized skeletal structures are unremarkable.
IMPRESSION: No active disease.
